# Patient Record
Sex: Female | Born: 1960 | Race: White | Hispanic: No | Marital: Married | Smoking: Never smoker
Health system: Southern US, Community
[De-identification: ages and names within clinical notes are randomized; demographics above are authoritative.]

## PROBLEM LIST (undated history)

## (undated) DIAGNOSIS — I10 Essential (primary) hypertension: Secondary | ICD-10-CM

## (undated) DIAGNOSIS — J8410 Pulmonary fibrosis, unspecified: Secondary | ICD-10-CM

## (undated) DIAGNOSIS — I471 Supraventricular tachycardia, unspecified: Secondary | ICD-10-CM

## (undated) DIAGNOSIS — I4949 Other premature depolarization: Secondary | ICD-10-CM

## (undated) DIAGNOSIS — E7800 Pure hypercholesterolemia, unspecified: Secondary | ICD-10-CM

## (undated) DIAGNOSIS — E119 Type 2 diabetes mellitus without complications: Secondary | ICD-10-CM

## (undated) DIAGNOSIS — I499 Cardiac arrhythmia, unspecified: Secondary | ICD-10-CM

## (undated) DIAGNOSIS — D45 Polycythemia vera: Secondary | ICD-10-CM

## (undated) DIAGNOSIS — I517 Cardiomegaly: Secondary | ICD-10-CM

## (undated) DIAGNOSIS — K219 Gastro-esophageal reflux disease without esophagitis: Secondary | ICD-10-CM

## (undated) DIAGNOSIS — IMO0002 Reserved for concepts with insufficient information to code with codable children: Secondary | ICD-10-CM

## (undated) DIAGNOSIS — R079 Chest pain, unspecified: Principal | ICD-10-CM

## (undated) DIAGNOSIS — I2510 Atherosclerotic heart disease of native coronary artery without angina pectoris: Secondary | ICD-10-CM

## (undated) DIAGNOSIS — B182 Chronic viral hepatitis C: Secondary | ICD-10-CM

## (undated) DIAGNOSIS — I440 Atrioventricular block, first degree: Secondary | ICD-10-CM

## (undated) DIAGNOSIS — E669 Obesity, unspecified: Secondary | ICD-10-CM

## (undated) DIAGNOSIS — E785 Hyperlipidemia, unspecified: Secondary | ICD-10-CM

## (undated) DIAGNOSIS — J189 Pneumonia, unspecified organism: Secondary | ICD-10-CM

## (undated) DIAGNOSIS — I739 Peripheral vascular disease, unspecified: Secondary | ICD-10-CM

## (undated) DIAGNOSIS — J45909 Unspecified asthma, uncomplicated: Secondary | ICD-10-CM

## (undated) DIAGNOSIS — R002 Palpitations: Secondary | ICD-10-CM

## (undated) DIAGNOSIS — R519 Headache, unspecified: Secondary | ICD-10-CM

## (undated) HISTORY — DX: Pulmonary fibrosis, unspecified: J84.10

## (undated) HISTORY — DX: Pure hypercholesterolemia, unspecified: E78.00

## (undated) HISTORY — DX: Cardiomegaly: I51.7

## (undated) HISTORY — DX: Chest pain, unspecified: R07.9

## (undated) HISTORY — DX: Hyperlipidemia, unspecified: E78.5

## (undated) HISTORY — DX: Supraventricular tachycardia: I47.1

## (undated) HISTORY — DX: Atrioventricular block, first degree: I44.0

## (undated) HISTORY — PX: CATARACT EXTRACTION W/ INTRAOCULAR LENS IMPLANT: SHX1309

## (undated) HISTORY — DX: Polycythemia vera: D45

## (undated) HISTORY — PX: COLONOSCOPY: SHX174

## (undated) HISTORY — DX: Essential (primary) hypertension: I10

## (undated) HISTORY — DX: Other premature depolarization: I49.49

## (undated) HISTORY — DX: Chronic viral hepatitis C: B18.2

## (undated) HISTORY — PX: LUMBAR LAMINECTOMY/DECOMPRESSION WITH DISCECTOMY: SHX7439

## (undated) HISTORY — DX: Palpitations: R00.2

## (undated) HISTORY — DX: Unspecified asthma, uncomplicated: J45.909

## (undated) HISTORY — PX: ABDOMINAL HYSTERECTOMY: SHX81

## (undated) HISTORY — PX: TONSILLECTOMY: SUR1361

## (undated) HISTORY — PX: FRACTURE SURGERY: SHX138

## (undated) HISTORY — PX: MYRINGOTOMY: SHX2060

## (undated) HISTORY — PX: CHOLECYSTECTOMY: SHX55

## (undated) HISTORY — DX: Supraventricular tachycardia, unspecified: I47.10

## (undated) HISTORY — DX: Obesity, unspecified: E66.9

## (undated) HISTORY — DX: Reserved for concepts with insufficient information to code with codable children: IMO0002

## (undated) HISTORY — DX: Atherosclerotic heart disease of native coronary artery without angina pectoris: I25.10

## (undated) HISTORY — PX: APPENDECTOMY: SHX54

## (undated) HISTORY — PX: ROTATOR CUFF REPAIR: SHX139

---

## 1993-12-14 HISTORY — PX: SINUS SURGERY WITH INSTATRAK: SHX5215

## 2004-01-04 ENCOUNTER — Encounter: Admission: RE | Admit: 2004-01-04 | Discharge: 2004-01-04 | Payer: Self-pay | Admitting: Infectious Diseases

## 2004-02-08 ENCOUNTER — Ambulatory Visit (HOSPITAL_COMMUNITY): Admission: RE | Admit: 2004-02-08 | Discharge: 2004-02-08 | Payer: Self-pay | Admitting: Infectious Diseases

## 2004-02-08 ENCOUNTER — Encounter: Admission: RE | Admit: 2004-02-08 | Discharge: 2004-02-08 | Payer: Self-pay | Admitting: Infectious Diseases

## 2006-12-14 HISTORY — PX: CARDIAC CATHETERIZATION: SHX172

## 2007-05-26 ENCOUNTER — Ambulatory Visit: Payer: Self-pay | Admitting: Cardiology

## 2007-06-02 ENCOUNTER — Ambulatory Visit: Payer: Self-pay | Admitting: Cardiology

## 2007-08-02 ENCOUNTER — Ambulatory Visit: Payer: Self-pay | Admitting: Cardiology

## 2007-08-03 ENCOUNTER — Ambulatory Visit: Payer: Self-pay | Admitting: Cardiology

## 2007-08-03 ENCOUNTER — Ambulatory Visit (HOSPITAL_COMMUNITY): Admission: RE | Admit: 2007-08-03 | Discharge: 2007-08-03 | Payer: Self-pay | Admitting: Cardiology

## 2007-08-16 ENCOUNTER — Ambulatory Visit: Payer: Self-pay | Admitting: Cardiology

## 2007-08-18 ENCOUNTER — Encounter (HOSPITAL_COMMUNITY): Admission: RE | Admit: 2007-08-18 | Discharge: 2007-09-13 | Payer: Self-pay | Admitting: Endocrinology

## 2008-08-08 ENCOUNTER — Ambulatory Visit (HOSPITAL_COMMUNITY): Admission: RE | Admit: 2008-08-08 | Discharge: 2008-08-08 | Payer: Self-pay | Admitting: Urology

## 2011-04-28 NOTE — Discharge Summary (Signed)
NAMESIREN, PORRATA              ACCOUNT NO.:  [id number]   MEDICAL RECORD NO.:  [id number]          PATIENT TYPE:  OIB   LOCATION:  2807                         FACILITY:  [facility]   PHYSICIAN:  Maple Mirza, PA   DATE OF BIRTH:  06-13-1961   DATE OF ADMISSION:  08/03/2007  DATE OF DISCHARGE:  08/03/2007                               DISCHARGE SUMMARY   ADDENDUM   This patient has allergies to aspirin, ibuprofen and peanuts.   This addendum concerns the patient's symptomatic premature ventricular  complexes.  I had mentioned in the original discharge summary a monitor  might be obtained for this patient because she has a reported history of  near syncope two weeks ago.  Another reason for the monitor might be to  see if Toprol XL 25 mg which has been started with this admission will  decrease the number of symptomatic premature ventricular complexes that  this patient is experiencing.      Maple Mirza, PA     GM/MEDQ  D:  08/03/2007  T:  08/04/2007  Job:  [id number]   cc:   Luis Abed, MD, [facility]  Dhruv Sherril Croon

## 2011-04-28 NOTE — Discharge Summary (Signed)
NAMEMAHALIA, Connie Ho              ACCOUNT NO.:  [id number]   MEDICAL RECORD NO.:  [id number]          PATIENT TYPE:  OIB   LOCATION:  2807                         FACILITY:  [facility]   PHYSICIAN:  Maple Mirza, PA   DATE OF BIRTH:  March 20, 1961   DATE OF ADMISSION:  08/03/2007  DATE OF DISCHARGE:  08/03/2007                               DISCHARGE SUMMARY   FINAL DIAGNOSES:  1. Six month history of left arm discomfort with exertion.  2. Six weeks of progressive exertional dyspnea.  3. Adenosine Myoview study in 06/08 demonstrating small anterior and      inferior defect with small degree of reversibility, ejection      fraction of 58%.  4. The patient has symptomatic PVCs.  5. On 08/03/07, left heart catheterization, study showed that the      right coronary artery is small and nondominant, the LAD has a 30%      stenosis after the first diagonal and the first diagonal has a 50%      proximal stenosis, the left circumflex at its obtuse marginal      branches are free of angiographically significant coronary artery      disease.  There is no mitral regurgitation, ejection fraction about      65%, Dr. Nona Dell.   SECONDARY DIAGNOSES:  1. Recent oncology workup/hematology workup for polycythemia with      studies and follow-up pending.  2. Risk factors for coronary artery disease are:      a.     Hypertension.      b.     Hypercholesterolemia.  3. Myoview study in 2003, no defects, ejection fraction of 62%.  4. Asthma.  5. Migraine headaches.  6. History of hepatitis C with no medical therapy.  7. Irritable bowel syndrome.  8. History of seizure disorder.  9. Obesity.  10.History of neurocardiogenic syncope with positive tilt study, [facility].   PAST SURGICAL HISTORY:  1. Sinus surgery.  2. Status post hysterectomy with bilateral oophorectomy for benign      endometriosis.  3. Status post cholecystectomy.   PROCEDURE PERFORMED:  On  08/03/07, left heart catheterization.  The  study as dictated above showed the LAD had a 30% stenosis after the  first diagonal, the first diagonal itself had a 50% proximal stenosis,  otherwise the left circumflex and the right coronary artery are  angiographically free of coronary artery disease, ejection fraction 65%,  Dr. Nona Dell.   BRIEF HISTORY:  The patient is a 50 year old female.  She has no  documented history of coronary artery disease but she has risk factors  including hypertension and dyslipidemia.  The patient does not have a  strong family coronary artery disease history, diabetes, peripheral  vascular occlusive disease.  The patient had presented in 06/08 with a  six month history of left arm discomfort associated with exertion and  relieved with rest.  There is no anterior chest discomfort, however, in  the last 6-8 weeks she has also noted significant exertional dyspnea.  She was referred  then for further evaluation.  The 12 lead  electrocardiogram shows normal sinus rhythm, 70 beats per minute with  left axis deviation, no ischemic changes.  At that time she was  scheduled for a repeat Myoview study.  This study showed small anterior  and inferior defects and was thought to be a low risk study.  The  patient then presented to [facility] on 08/02/07 with chest pain  radiating across the chest, not into the arm or the neck.  Her BNP on  admission was 16, but because the patient had an indeterminate stress  test in June, and has had no recurrent pain after a six month history of  exertional left arm pain, the patient was transferred to [facility] for a left heart catheterization.   HOSPITAL COURSE:  The patient had transfer admission from [facility] for diagnostic left heart catheterization.  This study showed  that the coronary arteries had only mild non-obstructive coronary artery  disease in the LAD and the first diagonal with  preserved ejection  fraction, patient discharging the same day.  She will go home on Toprol-  XL 25 mg daily, a new medication, primarily to help quell symptomatic,  PVCs.  She also has of medications of Protonix 40 mg daily, Diovan 160  mg daily, and Topamax 100 mg at bedtime.  She will also continue her  Maxzide 37.5/25 daily.   With regard to follow-up because the patient had a report of near  syncope about two weeks prior to this admission for which she did not  seek medical attention, there may be a question that the patient will be  put on a monitor when she sees Dr. Myrtis Ser.  The office visit with Dr. Myrtis Ser  is Tuesday, 08/16/07 at 9:45.   LABORATORY STUDIES:  The laboratory studies associated with this  hospitalization are troponin I studies times two were 0.02 and 0.02.  Her protime is 12.8, INR is 0.9.  On 08/02/07 white cells were 7.9,  hemoglobin 15.5, hematocrit 43.5, and platelets 293.  Serum electrolytes  show the sodium is 140, potassium is four, chloride 114, carbonate 29,  BUN is 10, creatinine 0.6, glucose 120, and the TSH 08/19 1.29.      Maple Mirza, PA     GM/MEDQ  D:  08/03/2007  T:  08/04/2007  Job:  [id number]   cc:   Roosevelt Locks, PA  Luis Abed, MD, [facility]  Learta Codding, MD,FACC

## 2011-04-28 NOTE — Assessment & Plan Note (Signed)
[facility]                          [facility] OFFICE NOTE   TENLEY, WINWARD                     MRN:          [id number]  DATE:08/16/2007                            DOB:          1961-06-14    Connie Ho is doing well.  We had assessed  her in June 2008 for some  chest discomfort.  She then had more symptoms and she was transferred to  [facility] for cardiac catheterization.  The study showed that the LAD  tapered to a small diameter in the mid and distal segment.  There was a  50% stenosis of the first diagonal, and this was a small caliber vessel.  There was a 30% stenosis in the mid LAD.  There were no clear-cut flow  limiting lesions.  The circumflex was a large vessel, and there was no  significant disease.  The right coronary is relatively small and  nondominant, and there was no significant disease.  The LV function was  65%, with no significant wall motion abnormalities.  It was felt that  the patient did not need an intervention.  She is to be treated  medically.   She returns now to the office and is feeling well.  She has no problems  with her catheterization site.  It had been mentioned that she had some  symptomatic PVCs.  There was a question of whether she should wear a  monitor, and she and I have discussed this today (see the discussion  below).   The patient also mentions that there is a question of  hyperparathyroidism, and this is being evaluated by Dr. Patrecia Pace in  [place], [place].  She is seeing Dr. Ubaldo Glassing for the follow-up of her  hematologic issues.  Also, there is question as to whether or not she  has hepatitis C.  This affects whether she can be on cholesterol  medications.  Certainly being on a statin would be recommended for her  secondary prevention, if it can be used.   PAST MEDICAL HISTORY:   ALLERGIES:  Antiphylactic reaction to ASPIRIN.  Hives from IBUPROFEN.   MEDICATIONS:  Maxide,  hydrochlorothiazide, Topamax, Diovan, Toprol 25  mg.   OTHER MEDICAL PROBLEMS:  See the list below.   REVIEW OF SYSTEMS:  The patient still has some mild palpitations.  However, she is not having any syncope or presyncope.  In general, she  is feeling somewhat better.  Her review of systems is otherwise  negative.   PHYSICAL EXAMINATION:  Blood pressure 134/95; she needs follow-up of her  blood pressure.  If her blood pressure remains elevated, she will need  the addition of another medicine (such as Norvasc).  The patient is  oriented to person, time and place.  Affect is normal.  HEENT:  Reveals no xanthelasma.  She has normal extraocular motion.  NECK:  There are no carotid bruits.  There is no jugular venous  distention.  LUNGS:  Clear.  Respiratory effort is not labored.  CARDIAC:  Exam reveals an S1 with an S2.  There are no clicks or  significant murmurs.  ABDOMEN:  Soft.  She has normal bowel sounds.  She has no peripheral  edema.   LABORATORY STUDIES:  None are done.   PROBLEMS:  1. CHEST DISCOMFORT.  We believe that there is not significant      coronary ischemia.  However, she does have mild coronary disease      and secondary prevention is very important.  2. HISTORY OF NEUROCARDIOGENIC SYNCOPE IN THE PAST.  With a positive      tilt test at [facility] in 1996.  3. HYPERTENSION.  She needs follow-up of this, to see if she needs      more medications.  4. HYPERLIPIDEMIA.  Using a statin would be very helpful.  I hope that      this can be strongly considered after her liver status is assessed.  5. HISTORY OF ASTHMA.  6. HISTORY OF HEPATITIS C.  7. HISTORY OF MIGRAINES.  8. HISTORY OF SEIZURE DISORDER.  9. OBESITY.  10.STATUS POST CHOLECYSTECTOMY.  11.STATUS POST HYSTERECTOMY.  12.STATUS POST SINUS SURGERY.  13.MILD LEFT VENTRICULAR HYPERTROPHY.  By 2-D echocardiogram in the      past.  14.HISTORY OF ANAPHYLAXIS FROM ASPIRIN and HISTORY OF HIVES FROM       IBUPROFEN.  15.MILD PALPITATIONS.   The patient has symptomatic infrequent PVCs.  I had a lengthy discussion  with the patient, explaining to her how she can feel post PVC beats.  I  spent time reassuring her.   I believe her cardiac status is stable.  We can see her back for  cardiology follow-up in 6 months.     Luis Abed, MD, [facility]  Electronically Signed    JDK/MedQ  DD: 08/16/2007  DT: 08/16/2007  Job #: [id number]   cc:   Norman Herrlich, M.D.  Boris M. Darovsky, M.D.

## 2011-04-28 NOTE — Cardiovascular Report (Signed)
Connie Ho, Connie Ho              ACCOUNT NO.:  [id number]   MEDICAL RECORD NO.:  [id number]          PATIENT TYPE:  OIB   LOCATION:  2807                         FACILITY:  [facility]   PHYSICIAN:  Jonelle Sidle, MD DATE OF BIRTH:  [birth year]   DATE OF PROCEDURE:  DATE OF DISCHARGE:                            CARDIAC CATHETERIZATION   PRIMARY CARE PHYSICIAN:  Doreen Beam, M.D.   CARDIOLOGIST:  Luis Abed, MD, [facility]   INDICATIONS:  Ms. Furber is a 50 year old woman with a previously  diagnosed history of neurocardiogenic syncope, symptomatic premature  ventricular complexes, hypertension, hyperlipidemia, and recent episodes  of chest pain.  She underwent an adenosine Cardiolite in June which was  overall low risk and revealed small anterior and inferior defects of  unclear significance for ischemia with a small degree of reversibility  by report.  She had normal ejection fraction noted at that time as well.  She was seen by Dr. Myrtis Ser in the office and subsequently admitted to the  hospital with recurrent episodes of chest pain, being evaluated by Dr.  Andee Lineman at that time.  She ruled out for myocardial infarction, and  following a discussion of the risks and benefits is now referred for a  diagnostic cardiac catheterization to clearly assess the coronary  anatomy.  Informed consent was obtained.   PROCEDURE PERFORMED:  1. Left heart catheterization.  2. Selective coronary angiography.  3. Left ventriculography.   ACCESS AND EQUIPMENT:  The area about the right femoral artery was  anesthetized with 1% lidocaine, and a 6-French sheath was placed in the  right femoral artery via the modified Seldinger technique.  Standard  preformed 6-French JL-4 and JR-4 catheters were used for selective  coronary angiography, and an angled pigtail catheter was used for left  heart catheterization and left ventriculography.  A total of 85 mL  Omnipaque were used.  The patient tolerated the  procedure well without  immediate complications.   HEMODYNAMIC RESULTS:  Aorta 122/66 mmHg.  Left ventricle 122/2 mmHg.   ANGIOGRAPHIC FINDINGS:  1. The left main coronary artery is free of significant flow-limiting      coronary atherosclerosis.  This vessel gives rise to the left      anterior descending and circumflex vessels.  2. The left anterior descending is a medium caliber vessel that tapers      to a small diameter in the mid to distal segment.  There are two      diagonal branches, the first of which has a 50% ostial stenosis.      These are relatively small in caliber.  There is a 30% stenosis      within the mid left anterior descending.  No clear flow-limiting      stenoses are noted, however.  3. The circumflex vessel is a medium to large caliber dominant vessel      with posterior descending branch and three obtuse marginal      branches.  The first obtuse marginal was quite large and      bifurcates.  There was no significant flow-limiting coronary  atherosclerosis noted.  4. The right coronary artery is relatively small and nondominant.      There was no significant flow-limiting coronary atherosclerosis      noted.   LEFT VENTRICULOGRAPHY:  Left ventriculography is performed in the RAO  projection and reveals an ejection fraction of approximately 65% with no  significant mitral regurgitation or focal anterior or inferior wall  motion abnormalities.   DIAGNOSES:  1. Mild coronary atherosclerosis without any major flow-limiting      disease within the large epicardial vessels.  The coronary system      is left dominant.  2. The left ventricular ejection fraction is approximately 65% without      significant mitral regurgitation, with a left ventricular end-      diastolic pressure of 2 mmHg.   DISCUSSION:  Reviewed the results with the patient, her family, and with  Dr. Andee Lineman by phone.  We will plan to discharge her home later today.  We are adding a  low-dose beta-blocker (Toprol-XL 25 mg daily), given her  history of symptomatic premature ventricular complexes as well as  previously diagnosed neurocardiogenic syncope.  She will have followup  with Dr. Myrtis Ser or physician assistant in the office over the next 2  weeks.      Jonelle Sidle, MD  Electronically Signed     SGM/MEDQ  D:  08/03/2007  T:  08/04/2007  Job:  [id number]   cc:   Sherian Maroon, MD, [facility]  Learta Codding, MD,FACC

## 2011-04-28 NOTE — Assessment & Plan Note (Signed)
[facility]                          [facility]ICE NOTE   Connie Ho, Connie Ho                     MRN:          [id number]  DATE:05/26/2007                            DOB:          Jan 19, 1961    REASON FOR CONSULTATION:  Connie Ho is a very pleasant 50 -year-old  female, with no documented history of coronary artery disease, but  numerous risk factors who is now referred to Dr.  Zackery Barefoot for  further evaluation of exertional dyspnea and associated left arm  discomfort.   The patient has actually been seen by several members of our team in the  past. In August 2003, she was seen here at [facility] in  consultation by Dr.  Simona Huh for evaluation of recurrent chest  pain. An adenosine stress test at that time showed no significant  perfusion defects; calculated ejection fraction 62%.   Prior to that, the patient had had a suboptimal exercise stress  echocardiogram in 2001 which was interpreted as negative. She also had a  2D echo in 2002 revealing normal left ventricular function with mild  left ventricular hypertrophy. More recently, the patient reports having  had a repeat 2D echo, per Dr.  Sherril Croon, in his office last week, but a  report of this study is still pending.   The patient's cardiac risk factors are notable for longstanding  hypertension (treated with medication since the age of 32) and  hyperlipidemia, but negative for diabetes mellitus, history of tobacco  smoking or family history of premature coronary artery disease.   The patient presents with complaint of a 6 month history of left arm  discomfort associated with exertion and relieved with rest. There is no  associated anterior chest discomfort. However, in the last 6-8 weeks,  she has also noted significant exertional dyspnea. Given these current  symptoms, she is now referred to [place] for further evaluation.  A recent 12-  lead electrocardiogram shows  normal sinus rhythm at 70 bpm with left  axis deviation and no ischemic changes.   ALLERGIES:  ASPIRIN (ANAPHYLAXIS) AND IBUPROFEN.   CURRENT MEDICATIONS:  1. Maxzide one tablet daily.  2. Diovan 80 mg daily.  3. Topamax 100 mg q nightly.   PAST MEDICAL HISTORY:  1. Recurrent angina pectoris.      a.     As outlined above.  2. Neurocardiogenic syncope.      a.     Positive tilt table test at [facility]       in [place].  3. Hypertension.  4. Hyperlipidemia.  5. Asthma.  6. Hepatitis C.  7. History of migraines.  8. History of seizure disorder.  9. Obesity.   PAST SURGICAL HISTORY:  1. Status post cholecystectomy.  2. Total abdominal hysterectomy/bilateral salpingo oophorectomy.  3. Sinus surgery.   SOCIAL HISTORY:  The patient is married, has four children and four  grandchildren. She has never smoked tobacco and denies alcohol use. She  works for [employer] in [place].   FAMILY HISTORY:  Father deceased at age 62, question complications of  stroke;  history of congestive heart failure. Mother is age 84, alive and  well with no known history of coronary disease.   REVIEW OF SYSTEMS:  The patient denies any symptoms of reflux disease.  Otherwise, as noted per HPI, remaining systems negative.   PHYSICAL EXAMINATION:  Blood pressure 140/98, pulse 73 and irregular.  Weight is 203.  GENERAL: A 50 year old female, sitting upright in no distress.  HEENT: Normocephalic, atraumatic.  NECK: Palpable bilateral carotid pulses without bruits; no JVD at 90  degrees.  LUNGS:  Clear to auscultation in all fields.  HEART: Regular rate and rhythm (S1, S2). No significant murmurs. No  rales.  ABDOMEN: Protuberant, nontender with intact bowel sounds.  EXTREMITIES: Palpable distal pulses with trace pedal edema.  NEURO: No focal deficit.   Recent laboratory data:  Total cholesterol is 281, triglycerides 169, HDL 56, LDL of 191. CBC  notable for mildly  elevated hemoglobin of 16/hematocrit 46. Renal  function and electrolytes within normal limits, mildly elevated glucose  at 107. TSH 1.19.   IMPRESSION:  1. Exertional dyspnea/left arm discomfort.      a.     Question anginal equivalent.      b.     Negative adenosine stress Cardiolite: Ejection fraction of       62%, August 2003.      c.     Negative inadequate exercise stress echocardiogram, March       2001.  2. Longstanding hypertension.      a.     Mild LVH by 2D echo in 2002.  3. Remote history of neurocardiogenic syncope.      a.     Status post positive tilt table test at [facility] in 1996.  4. Asthma.  5. ASPIRIN ALLERGY.  6. Longstanding history of palpitations.  7. Obesity.  8. Hyperlipidemia.   PLAN:  1. Schedule repeat pharmacologic stress test with an adenosine      Cardiolite for risk stratification. We will keep a low threshold      for proceeding with a diagnostic coronary angiogram if there is any      suggestion of ischemia.  2. Request recent 2D echo report from Dr.  Sherril Croon office.  3. Schedule early return clinic followup with myself and Dr.  Zackery Barefoot in the next several weeks for review of stress test results,      and further recommendations.      Rozell Searing, PA-C  Electronically Signed      Luis Abed, MD, [facility]  Electronically Signed   GS/MedQ  DD: 05/26/2007  DT: 05/26/2007  Job #: [id number]   cc:   Doreen Beam

## 2011-09-22 DIAGNOSIS — R072 Precordial pain: Secondary | ICD-10-CM

## 2011-09-28 DIAGNOSIS — R0602 Shortness of breath: Secondary | ICD-10-CM

## 2011-09-28 DIAGNOSIS — R079 Chest pain, unspecified: Secondary | ICD-10-CM

## 2011-09-29 ENCOUNTER — Encounter: Payer: Self-pay | Admitting: Cardiovascular Disease

## 2011-09-29 ENCOUNTER — Telehealth: Payer: Self-pay | Admitting: *Deleted

## 2011-09-29 ENCOUNTER — Encounter: Payer: Self-pay | Admitting: *Deleted

## 2011-09-29 ENCOUNTER — Ambulatory Visit (INDEPENDENT_AMBULATORY_CARE_PROVIDER_SITE_OTHER): Payer: 59 | Admitting: Cardiovascular Disease

## 2011-09-29 DIAGNOSIS — I10 Essential (primary) hypertension: Secondary | ICD-10-CM

## 2011-09-29 DIAGNOSIS — R0602 Shortness of breath: Secondary | ICD-10-CM

## 2011-09-29 DIAGNOSIS — R9430 Abnormal result of cardiovascular function study, unspecified: Secondary | ICD-10-CM

## 2011-09-29 DIAGNOSIS — R079 Chest pain, unspecified: Secondary | ICD-10-CM

## 2011-09-29 MED ORDER — NITROGLYCERIN 0.4 MG SL SUBL: 0.4000 mg | SUBLINGUAL_TABLET | SUBLINGUAL | Status: DC | PRN

## 2011-09-29 NOTE — Patient Instructions (Addendum)
Your physician has requested that you have a cardiac catheterization. Cardiac catheterization is used to diagnose and/or treat various heart conditions. Doctors may recommend this procedure for a number of different reasons. The most common reason is to evaluate chest pain. Chest pain can be a symptom of coronary artery disease (CAD), and cardiac catheterization can show whether plaque is narrowing or blocking your heart's arteries. This procedure is also used to evaluate the valves, as well as measure the blood flow and oxygen levels in different parts of your heart. For further information please visit [email or web address] Please follow instruction sheet, as given. Use Nitroglycerin 0.4 mg under tongue as needed as directed for chest pain.

## 2011-09-29 NOTE — Progress Notes (Signed)
HPI   this is a 50 year old female who is here today for a followup visit. She was seen recently at [facility] due to the chest pain. The patient ruled out for myocardial infarction in her ECG did not show any acute changes. She was discharged home with an arrangement for an outpatient treadmill stress test. Since her hospital discharge, she had more episodes of chest pain described as substernal chest tightness radiating to her throat. These episodes happened while she was going upstairs. One episode lasted for 15 minutes. She felt sick in her stomach. She underwent a treadmill stress test and was able to exercise for 7 minutes. However, at peak stress, she had 1 mm of horizontal ST depression in the inferior and inferolateral leads which persisted for 4 minutes into recovery and was associated with substernal chest tightness. The patient did have cardiac catheterization in 2008 which showed mild nonobstructive coronary artery disease with a moderate lesion in a diagonal branch. She is allergic to aspirin and reports hives with it. She does have family history of coronary artery disease.  she did have associated heartburn but that has actually improved after she was started on Protonix.  She also has known history of asthma but that has been under reasonable control with no recent wheezing.  Allergies  Allergen Reactions  . Aspirin Hives  . Ibuprofen Hives     No current outpatient prescriptions on file prior to visit.     Past Medical History  Diagnosis Date  . Chest pain, unspecified 2008    Cardiac Catheterization   . Palpitations   . Pure hypercholesterolemia   . Unspecified asthma   . Postinflammatory pulmonary fibrosis   . Cardiomegaly   . Polycythemia vera   . Chronic hepatitis C without mention of hepatic coma   . Obesity, unspecified   . Other and unspecified hyperlipidemia   . Unspecified asthma   . Degenerative disk disease   . Other premature beats   . First degree  atrioventricular block   . Unspecified essential hypertension      Past Surgical History  Procedure Date  . Cholecystectomy     post  . Abdominal hysterectomy     Bilateral salpingo-oophorectomy  . Sinus surgery with instatrak 1995  . Cardiac catheterization 2008    mild CAD. 50% diagonal stenosis.      Family History  Problem Relation Age of Onset  . Stroke Father 49  . Coronary artery disease Mother      History   Social History  . Marital Status: Married    Spouse Name: STEVEN     Number of Children: 4  . Years of Education: N/A   Occupational History  . SELF EMPLOYED    Social History Main Topics  . Smoking status: Never Smoker   . Smokeless tobacco: Never Used  . Alcohol Use: No  . Drug Use: No  . Sexually Active: Not on file   Other Topics Concern  . Not on file   Social History Narrative  . No narrative on file     ROS Constitutional: Negative for fever, chills, diaphoresis, activity change, appetite change and fatigue.  HENT: Negative for hearing loss, nosebleeds, congestion, sore throat, facial swelling, drooling, trouble swallowing, neck pain, voice change, sinus pressure and tinnitus.  Eyes: Negative for photophobia, pain, discharge and visual disturbance.  Respiratory: Negative for apnea, cough and wheezing.  Cardiovascular: Negative for palpitations and leg swelling.  Gastrointestinal: Negative for nausea, vomiting, abdominal pain, diarrhea,  constipation, blood in stool and abdominal distention.  Genitourinary: Negative for dysuria, urgency, frequency, hematuria and decreased urine volume.  Musculoskeletal: Negative for myalgias, back pain, joint swelling, arthralgias and gait problem.  Skin: Negative for color change, pallor, rash and wound.  Neurological: Negative for dizziness, tremors, seizures, syncope, speech difficulty, weakness, light-headedness, numbness and headaches.  Psychiatric/Behavioral: Negative for suicidal ideas,  hallucinations, behavioral problems and agitation. The patient is not nervous/anxious.     PHYSICAL EXAM   BP 116/78  Pulse 61  Ht 5\' 6"  (1.676 m)  Wt 200 lb (90.719 kg)  BMI 32.28 kg/m2  Constitutional: She is oriented to person, place, and time. She appears well-developed and well-nourished. No distress.  HENT: No nasal discharge.  Head: Normocephalic and atraumatic.  Eyes: Pupils are equal, round, and reactive to light. Right eye exhibits no discharge. Left eye exhibits no discharge.  Neck: Normal range of motion. Neck supple. No JVD present. No thyromegaly present.  Cardiovascular: Normal rate, regular rhythm, normal heart sounds and intact distal pulses. Exam reveals no gallop and no friction rub.  No murmur heard.  Pulmonary/Chest: Effort normal and breath sounds normal. No stridor. No respiratory distress. She has no wheezes. She has no rales. She exhibits no tenderness.  Abdominal: Soft. Bowel sounds are normal. She exhibits no distension. There is no tenderness. There is no rebound and no guarding.  Musculoskeletal: Normal range of motion. She exhibits no edema and no tenderness.  Neurological: She is alert and oriented to person, place, and time. Coordination normal.  Skin: Skin is warm and dry. No rash noted. She is not diaphoretic. No erythema. No pallor.  Psychiatric: She has a normal mood and affect. Her behavior is normal. Judgment and thought content normal.      ASSESSMENT AND PLAN

## 2011-09-29 NOTE — Telephone Encounter (Signed)
(  L) HEART CATH JV LAB DR. ARIDA OCT. 24, 2012  ARRIVE 0930 FOR 1030 CASE

## 2011-09-29 NOTE — Assessment & Plan Note (Signed)
Her blood pressure was running low. It improved after cutting the dose of diuretic to half a tablet once daily.

## 2011-09-29 NOTE — Assessment & Plan Note (Signed)
The patient continues to have substernal chest tightness in spite of treating her with Protonix for presumed gastroesophageal reflux disease. His current symptoms are associated with physical activities and are worrisome for angina.   Treadmill stress test  was abnormal  with 1 mm of ST depression in the inferior and inferolateral leads.  I discussed the stress test results with the patient extensively. Although, a treadmill stress test can be false positive especially in females, I am concerned about the nature of her symptoms which seems to be highly suggestive of angina at this time. Also her chest pain did not resolve after treating her for presumed GERD.   Due to that, I recommend proceeding with cardiac catheterization and possible coronary intervention. Risks, benefits and alternatives were discussed with the patient. She is allergic to aspirin. I provided her with Nitrostat to be used as needed. If PCI is needed, it will likely need to be staged until she is loaded with an antiplatelet agent.

## 2011-09-29 NOTE — Telephone Encounter (Signed)
Auth # [id number] exp 11/13/11

## 2011-10-01 LAB — PROTIME-INR

## 2011-10-05 ENCOUNTER — Encounter: Payer: Self-pay | Admitting: *Deleted

## 2011-10-06 ENCOUNTER — Encounter: Payer: Self-pay | Admitting: Cardiovascular Disease

## 2011-10-07 ENCOUNTER — Inpatient Hospital Stay (HOSPITAL_BASED_OUTPATIENT_CLINIC_OR_DEPARTMENT_OTHER)
Admission: RE | Admit: 2011-10-07 | Discharge: 2011-10-07 | Disposition: A | Discharge status: Home / Self Care | Payer: 59 | Source: Ambulatory Visit | Attending: Cardiovascular Disease | Admitting: Cardiovascular Disease

## 2011-10-07 DIAGNOSIS — R079 Chest pain, unspecified: Secondary | ICD-10-CM | POA: Insufficient documentation

## 2011-10-07 DIAGNOSIS — I2510 Atherosclerotic heart disease of native coronary artery without angina pectoris: Secondary | ICD-10-CM

## 2011-10-13 NOTE — Cardiovascular Report (Signed)
Connie Ho, BOEH              ACCOUNT NO.:  [id number]  MEDICAL RECORD NO.:  [id number]  LOCATION:                                 FACILITY:  PHYSICIAN:  Lorine Bears, MD     DATE OF BIRTH:  Aug 25, 1961  DATE OF PROCEDURE:  10/07/2011 DATE OF DISCHARGE:                           CARDIAC CATHETERIZATION   PRIMARY CARE PHYSICIAN:  Fransisco Hertz, MD  PROCEDURES PERFORMED: 1. Left heart catheterization. 2. Coronary angiography. 3. Left ventricular angiography.  INDICATION AND CLINICAL HISTORY:  This is a 50 year old female who presented to [facility] with substernal chest pain.  She ruled out for myocardial infarction and was discharged home to have an outpatient stress test.  She underwent a treadmill stress test, which showed 1 mm of ST depression in the inferior and anterolateral leads, associated with mild chest tightness.  The patient continued to have chest pain in spite of treating her gastroesophageal reflux disease. Thus, I recommended proceeding with cardiac catheterization and possible coronary intervention.  She did have cardiac catheterization in 2008 which showed a moderate 50% lesion in a diagonal branch.  STUDY DETAILS:  A standard informed consent was obtained.  The right groin area was prepped in a sterile fashion.  It was anesthetized with 1% lidocaine.  A 4-French sheath was placed in the right femoral artery after an anterior puncture.  Coronary angiography was performed with a JL-4, 3DRC and a pigtail catheter.  All catheter exchanges were done over the wire.  The patient tolerated the procedure well with no immediate complications.  The sheath was removed and manual pressure was applied.  STUDY FINDINGS:  Hemodynamic Findings:  Left ventricular pressure is 170/16 with a left ventricular end-diastolic pressure of 18 mmHg. Aortic pressure is 172/88 with a mean pressure of 112 mmHg.  Left ventricular angiography:  This showed normal LV  systolic function and wall motion with an estimated ejection fraction of 60%.  Coronary angiography: 1. Left main coronary artery: The vessel is normal in size and free of     any significant disease. 2. Left circumflex artery:  The vessel is large in size and dominant.     It is smooth and has no significant atherosclerosis or obstructive     disease.  OM1 is very small.  OM2 is medium in size, and OM3 is     normal in size.  Distally, there is a PDA and 1 posterolateral     branch and both of them are free of significant disease. 3. Left anterior descending artery: The vessel is normal in size and     free of any significant disease.  First diagonal is a medium-sized     branch, but definitely less than 2 mm in diameter.  There is a 60%     ostial lesion.  Second and 3rd diagonals are very small in size. 4. Right coronary artery: The vessel is small in size and nondominant.     It is free of any significant disease.  STUDY CONCLUSIONS: 1. No significant coronary artery disease.  There is a 60% ostial     lesion in a small diagonal branch which is less than 2 mm  in     diameter. 2. Normal LV systolic function. 3. Moderately elevated systemic pressure and mildly elevated left     ventricular end-diastolic pressure.  RECOMMENDATIONS:  Medical therapy is advised.  No revascularization is indicated.  The diagonal branch is medium in size and the diameter is about 1.5 mm.  No angioplasty to this location is recommended.     Lorine Bears, MD     MA/MEDQ  D:  10/07/2011  T:  10/07/2011  Job:  [id number]  cc:   Fransisco Hertz, M.D.  Electronically Signed by Lorine Bears MD on 10/13/2011 04:08:53 PM

## 2011-10-29 ENCOUNTER — Encounter: Payer: Self-pay | Admitting: Cardiovascular Disease

## 2011-10-29 ENCOUNTER — Encounter: Payer: 59 | Admitting: Cardiovascular Disease

## 2011-11-23 ENCOUNTER — Encounter: Payer: 59 | Admitting: Cardiovascular Disease

## 2011-12-25 ENCOUNTER — Encounter: Payer: 59 | Admitting: Cardiovascular Disease

## 2013-01-12 ENCOUNTER — Encounter (INDEPENDENT_AMBULATORY_CARE_PROVIDER_SITE_OTHER): Payer: Self-pay | Admitting: *Deleted

## 2013-06-05 ENCOUNTER — Encounter: Payer: Self-pay | Admitting: Cardiovascular Disease

## 2013-06-05 DIAGNOSIS — I4891 Unspecified atrial fibrillation: Secondary | ICD-10-CM

## 2013-06-05 DIAGNOSIS — I498 Other specified cardiac arrhythmias: Secondary | ICD-10-CM

## 2013-06-22 ENCOUNTER — Ambulatory Visit (INDEPENDENT_AMBULATORY_CARE_PROVIDER_SITE_OTHER): Payer: 59 | Admitting: Physician Assistant

## 2013-06-22 ENCOUNTER — Encounter: Payer: Self-pay | Admitting: Physician Assistant

## 2013-06-22 VITALS — BP 129/86 | HR 82 | Ht 66.0 in | Wt 208.0 lb

## 2013-06-22 DIAGNOSIS — I2510 Atherosclerotic heart disease of native coronary artery without angina pectoris: Secondary | ICD-10-CM

## 2013-06-22 DIAGNOSIS — I471 Supraventricular tachycardia: Secondary | ICD-10-CM | POA: Insufficient documentation

## 2013-06-22 MED ORDER — DILTIAZEM HCL ER BEADS 120 MG PO CP24: 120.0000 mg | ORAL_CAPSULE | Freq: Two times a day (BID) | ORAL | Status: DC

## 2013-06-22 NOTE — Patient Instructions (Addendum)
   Finish current supply of the Toprol, then   Increase Tiazac (Diltiazem) to 120mg  twice a day  - new sent to pharm (take 8:00 am & 8:00 pm) Continue all other current medications. Follow up in  1 month

## 2013-06-22 NOTE — Assessment & Plan Note (Signed)
Patient reports improvement of her frequent tachycardia palpitations following the recent addition of diltiazem. However, she continues to experience these episodes several times a week, particularly when preparing for bed. Therefore, will increase diltiazem to 120 twice a day, with concomitant discontinuation of Toprol, after she finishes her current supply. As I had previously indicated, my preference is to treat her SVT with a calcium channel blocker, given her underlying asthma.

## 2013-06-22 NOTE — Progress Notes (Signed)
Primary Cardiologist: Jerral Bonito, MD (new)   HPI: Post hospital followup from [facility], following evaluation for palpitations and possible atrial fibrillation.  Patient presented with history of nonobstructive CAD, by cardiac catheterization in 2008, following a false positive GXT. She had known NL LVF, and long-standing history of palpitations, but with no previously documented arrhythmia. 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  Admission EKG yielded narrow complex tachycardia at approximately 180 bpm. Troponins, TSH NL.   - Echocardiogram, June 23: EF 65%, no focal WMAs; no significant valvular abnormalities.  Following initial treatment with IV diltiazem, with subsequent conversion to NSR, we recommended adding low-dose short-acting diltiazem, which was subsequently converted to 120 mg daily, by time of discharge. Patient was on Lopressor PTA, which was reduced to 25 mg daily at discharge, secondary to development of sinus bradycardia in the 50-55 bpm range. Of note, we recommended that patient be treated solely with calcium channel blocker, if possible, given her underlying asthma. We also found no definite evidence for anticoagulation.  She presents today reporting significant improvement overall in the frequency and duration of her palpitations. Specifically, she no longer feels them during the day. However, she continues to have them several times a week when laying in bed at night. These are minimally symptomatic, and typically last less than 1 minute in duration.   Twelve-lead EKG today, reviewed by me, indicates NSR 82 bpm; NSST changes   Allergies  Allergen Reactions  . Peanut-Containing Drug Products   . Aspirin Hives  . Ibuprofen Hives    Current Outpatient Prescriptions  Medication Sig Dispense Refill  . albuterol (PROVENTIL) (2.5 MG/3ML) 0.083% nebulizer solution Take 2.5 mg by  nebulization every 6 (six) hours as needed for wheezing.      . diltiazem (TIAZAC) 120 MG 24 hr capsule Take 120 mg by mouth daily.       . metoprolol succinate (TOPROL-XL) 25 MG 24 hr tablet Take 25 mg by mouth daily.       . nitroGLYCERIN (NITROSTAT) 0.4 MG SL tablet Place 1 tablet (0.4 mg total) under the tongue every 5 (five) minutes as needed for chest pain.  25 tablet  3  . pantoprazole (PROTONIX) 40 MG tablet Take 40 mg by mouth daily.        . traMADol (ULTRAM) 50 MG tablet Take 50 mg by mouth every 8 (eight) hours as needed.       . VENTOLIN HFA 108 (90 BASE) MCG/ACT inhaler Inhale 2 puffs into the lungs every 6 (six) hours as needed.        No current facility-administered medications for this visit.    Past Medical History  Diagnosis Date  . Chest pain, unspecified 2008    Cardiac Catheterization   . Palpitations   . Pure hypercholesterolemia   . Unspecified asthma(493.90)   . Postinflammatory pulmonary fibrosis   . Cardiomegaly   . Polycythemia vera(238.4)   . Chronic hepatitis C without mention of hepatic coma   . Obesity, unspecified   . Other and unspecified hyperlipidemia   . Unspecified asthma(493.90)   . Degenerative disk disease   . Other premature beats   . First degree atrioventricular block   . Unspecified essential hypertension   . PSVT (paroxysmal supraventricular tachycardia)   . CAD (coronary artery disease)     Nonobstructive, 09/2011 (false positive GXT)    Past Surgical History  Procedure Laterality Date  . Cholecystectomy  post  . Abdominal hysterectomy      Bilateral salpingo-oophorectomy  . Sinus surgery with instatrak  1995  . Cardiac catheterization  2008    mild CAD. 50% diagonal stenosis.     History   Social History  . Marital Status: Married    Spouse Name: STEVEN     Number of Children: 4  . Years of Education: N/A   Occupational History  . SELF EMPLOYED    Social History Main Topics  . Smoking status: Never Smoker    . Smokeless tobacco: Never Used  . Alcohol Use: No  . Drug Use: No  . Sexually Active: Not on file   Other Topics Concern  . Not on file   Social History Narrative  . No narrative on file    Family History  Problem Relation Age of Onset  . Stroke Father 54  . Coronary artery disease Mother     ROS: no nausea, vomiting; no fever, chills; no melena, hematochezia; no claudication  PHYSICAL EXAM: BP 129/86  Pulse 82  Ht 5\' 6"  (1.676 m)  Wt 208 lb (94.348 kg)  BMI 33.59 kg/m2 GENERAL: 52 year old female; NAD HEENT: NCAT, PERRLA, EOMI; sclera clear; no xanthelasma NECK: no JVD; no TM LUNGS: CTA bilaterally CARDIAC: RRR (S1, S2); no significant murmurs; no rubs or gallops ABDOMEN: soft, non-tender; intact BS EXTREMETIES: no significant peripheral edema SKIN: warm/dry; no obvious rash/lesions MUSCULOSKELETAL: no joint deformity NEURO: no focal deficit; NL affect   EKG: reviewed and available in Electronic Records   ASSESSMENT & PLAN:  PSVT (paroxysmal supraventricular tachycardia) Patient reports improvement of her frequent tachycardia palpitations following the recent addition of diltiazem. However, she continues to experience these episodes several times a week, particularly when preparing for bed. Therefore, will increase diltiazem to 120 twice a day, with concomitant discontinuation of Toprol, after she finishes her current supply. As I had previously indicated, my preference is to treat her SVT with a calcium channel blocker, given her underlying asthma.  CAD (coronary artery disease) No further workup indicated. Patient reports no CP, and recent hospitalization notable for NL cardiac markers and a NL echocardiogram.    Gene Lillar Bianca, PAC

## 2013-06-22 NOTE — Assessment & Plan Note (Signed)
No further workup indicated. Patient reports no CP, and recent hospitalization notable for NL cardiac markers and a NL echocardiogram.

## 2013-07-28 ENCOUNTER — Encounter: Payer: Self-pay | Admitting: Physician Assistant

## 2013-07-28 ENCOUNTER — Ambulatory Visit (INDEPENDENT_AMBULATORY_CARE_PROVIDER_SITE_OTHER): Payer: 59 | Admitting: Physician Assistant

## 2013-07-28 VITALS — BP 128/86 | HR 99 | Ht 66.0 in | Wt 209.0 lb

## 2013-07-28 DIAGNOSIS — R079 Chest pain, unspecified: Secondary | ICD-10-CM

## 2013-07-28 DIAGNOSIS — I471 Supraventricular tachycardia: Secondary | ICD-10-CM

## 2013-07-28 DIAGNOSIS — I10 Essential (primary) hypertension: Secondary | ICD-10-CM

## 2013-07-28 NOTE — Patient Instructions (Signed)
Continue all current medications. Your physician wants you to follow up in: 6 months.  You will receive a reminder letter in the mail one-two months in advance.  If you don't receive a letter, please call our office to schedule the follow up appointment   

## 2013-07-28 NOTE — Progress Notes (Signed)
Primary Cardiologist: Jerral Bonito, MD   HPI: Scheduled one-month followup.  Clinically, she reports marked improvement on the increased dose of diltiazem. Her palpitations now occur 1-2x/week, as opposed to previously occurring several times a day.  She also reports 2 recent episodes of CP this week, occurring at rest and lasting only a few seconds in duration. She characterized these as a tightness, and denied any associated tach palpitations. She also denies any interim development of exertional CP.   Twelve-lead EKG today, reviewed by me, indicates NSR 99 bpm; NSST changes  Allergies  Allergen Reactions  . Peanut-Containing Drug Products   . Aspirin Hives  . Ibuprofen Hives    Current Outpatient Prescriptions  Medication Sig Dispense Refill  . albuterol (PROVENTIL) (2.5 MG/3ML) 0.083% nebulizer solution Take 2.5 mg by nebulization every 6 (six) hours as needed for wheezing.      . diltiazem (TIAZAC) 120 MG 24 hr capsule Take 1 capsule (120 mg total) by mouth 2 (two) times daily.  60 capsule  6  . hydrOXYzine (ATARAX/VISTARIL) 25 MG tablet Take 25 mg by mouth at bedtime.      . pantoprazole (PROTONIX) 40 MG tablet Take 40 mg by mouth 2 (two) times daily.       . traMADol (ULTRAM) 50 MG tablet Take 50 mg by mouth every 8 (eight) hours as needed.       . VENTOLIN HFA 108 (90 BASE) MCG/ACT inhaler Inhale 2 puffs into the lungs every 6 (six) hours as needed.        No current facility-administered medications for this visit.    Past Medical History  Diagnosis Date  . Chest pain, unspecified 2008    Cardiac Catheterization   . Palpitations   . Pure hypercholesterolemia   . Unspecified asthma(493.90)   . Postinflammatory pulmonary fibrosis   . Cardiomegaly   . Polycythemia vera(238.4)   . Chronic hepatitis C without mention of hepatic coma   . Obesity, unspecified   . Other and unspecified hyperlipidemia   . Unspecified asthma(493.90)   . Degenerative disk disease   . Other  premature beats   . First degree atrioventricular block   . Unspecified essential hypertension   . PSVT (paroxysmal supraventricular tachycardia)   . CAD (coronary artery disease)     Nonobstructive, 09/2011 (false positive GXT)  . HTN (hypertension)     Past Surgical History  Procedure Laterality Date  . Cholecystectomy      post  . Abdominal hysterectomy      Bilateral salpingo-oophorectomy  . Sinus surgery with instatrak  1995  . Cardiac catheterization  2008    mild CAD. 50% diagonal stenosis.     History   Social History  . Marital Status: Married    Spouse Name: STEVEN     Number of Children: 4  . Years of Education: N/A   Occupational History  . SELF EMPLOYED    Social History Main Topics  . Smoking status: Never Smoker   . Smokeless tobacco: Never Used  . Alcohol Use: No  . Drug Use: No  . Sexual Activity: Not on file   Other Topics Concern  . Not on file   Social History Narrative  . No narrative on file    Family History  Problem Relation Age of Onset  . Stroke Father 40  . Coronary artery disease Mother     ROS: no nausea, vomiting; no fever, chills; no melena, hematochezia; no claudication  PHYSICAL EXAM: BP 128/86  Pulse 99  Ht 5\' 6"  (1.676 m)  Wt 209 lb (94.802 kg)  BMI 33.75 kg/m2 GENERAL: 52 year old female; NAD  HEENT: NCAT, PERRLA, EOMI; sclera clear; no xanthelasma  NECK: no JVD; no TM  LUNGS: CTA bilaterally  CARDIAC: RRR (S1, S2); no significant murmurs; no rubs or gallops  ABDOMEN: soft, non-tender; intact BS  EXTREMETIES: no significant peripheral edema  SKIN: warm/dry; no obvious rash/lesions  MUSCULOSKELETAL: no joint deformity  NEURO: no focal deficit; NL affect    EKG: reviewed and available in Electronic Records   ASSESSMENT & PLAN:  PSVT (paroxysmal supraventricular tachycardia) She returns today suggesting further improvement of her palpitations, in both frequency and duration, since the recent increase in  diltiazem dose. At time of last OV, I recommended stopping Toprol, in favor of continued treatment with calcium channel blocker, secondary to underlying asthma.  Chest pain Recent episodes of CP are quite atypical. I reviewed her history, notable for 2 prior cardiac catheterizations, most recently October 2012, and each preceded by a false positive GXT. Therefore, I do not recommend a repeat stress test at this time; however, we will continue to monitor closely for any development of exertional chest pain. Of note, I did consider adding low-dose ASA; however, she has a documented allergy to this. She also declined treatment with Plavix, citing ongoing evaluation for recent gross hematuria, due to a benign bladder lesion.  HTN (hypertension) Well-controlled on current medication regimen    Gene Imogene Gravelle, PAC

## 2013-07-28 NOTE — Assessment & Plan Note (Signed)
Recent episodes of CP are quite atypical. I reviewed her history, notable for 2 prior cardiac catheterizations, most recently October 2012, and each preceded by a false positive GXT. Therefore, I do not recommend a repeat stress test at this time; however, we will continue to monitor closely for any development of exertional chest pain. Of note, I did consider adding low-dose ASA; however, she has a documented allergy to this. She also declined treatment with Plavix, citing ongoing evaluation for recent gross hematuria, due to a benign bladder lesion.

## 2013-07-28 NOTE — Assessment & Plan Note (Signed)
She returns today suggesting further improvement of her palpitations, in both frequency and duration, since the recent increase in diltiazem dose. At time of last OV, I recommended stopping Toprol, in favor of continued treatment with calcium channel blocker, secondary to underlying asthma.

## 2013-07-28 NOTE — Assessment & Plan Note (Signed)
Well-controlled on current medication regimen 

## 2014-02-12 ENCOUNTER — Ambulatory Visit (INDEPENDENT_AMBULATORY_CARE_PROVIDER_SITE_OTHER): Payer: 59 | Admitting: Cardiovascular Disease

## 2014-02-12 ENCOUNTER — Encounter: Payer: Self-pay | Admitting: Cardiovascular Disease

## 2014-02-12 VITALS — BP 165/98 | HR 71 | Ht 66.0 in | Wt 192.0 lb

## 2014-02-12 DIAGNOSIS — I2510 Atherosclerotic heart disease of native coronary artery without angina pectoris: Secondary | ICD-10-CM

## 2014-02-12 DIAGNOSIS — I10 Essential (primary) hypertension: Secondary | ICD-10-CM

## 2014-02-12 DIAGNOSIS — I471 Supraventricular tachycardia: Secondary | ICD-10-CM

## 2014-02-12 DIAGNOSIS — M779 Enthesopathy, unspecified: Secondary | ICD-10-CM

## 2014-02-12 NOTE — Patient Instructions (Signed)
Continue all current medications. Your physician wants you to follow up in: 6 months.  You will receive a reminder letter in the mail one-two months in advance.  If you don't receive a letter, please call our office to schedule the follow up appointment   

## 2014-02-12 NOTE — Progress Notes (Signed)
Patient ID: Connie Ho, female   DOB: [birth year], 53 y.o.   MRN: [id number]      SUBJECTIVE: The patient is a 53 year old woman was here to followup for paroxysmal supraventricular tachycardia. She has a history of hypertension and nonobstructive coronary artery disease. She also has a history of asthma and has been treated for PSVT with calcium channel blockers for this reason. She seldom experiences palpitations, and notices them more at the end of a long day when she feels fatigued. One day last week she was upset and noted her heart rate was 200 beats per minute but this resolved when she calmed down. She said for the most part, her palpitations are well controlled on the current dose of diltiazem. She denies chest pain and shortness of breath as well as leg swelling. She has been experiencing significant upper extremity tendinitis after starting ciprofloxacin for bladder issues. She has since stopped this medication but continues to have significant pain and tenderness in her bilateral upper extremities.    Allergies  Allergen Reactions  . Peanut-Containing Drug Products   . Aspirin Hives  . Ibuprofen Hives    Current Outpatient Prescriptions  Medication Sig Dispense Refill  . albuterol (PROVENTIL) (2.5 MG/3ML) 0.083% nebulizer solution Take 2.5 mg by nebulization every 6 (six) hours as needed for wheezing.      . diltiazem (TIAZAC) 120 MG 24 hr capsule Take 1 capsule (120 mg total) by mouth 2 (two) times daily.  60 capsule  6  . HYDROcodone-acetaminophen (NORCO/VICODIN) 5-325 MG per tablet Take 1 tablet by mouth every 6 (six) hours as needed.       . hydrOXYzine (ATARAX/VISTARIL) 25 MG tablet Take 25 mg by mouth at bedtime.      . pantoprazole (PROTONIX) 40 MG tablet Take 40 mg by mouth 2 (two) times daily.       . predniSONE (DELTASONE) 10 MG tablet Take 10 mg by mouth 2 (two) times daily.      . VENTOLIN HFA 108 (90 BASE) MCG/ACT inhaler Inhale 2 puffs into the lungs every 6  (six) hours as needed.        No current facility-administered medications for this visit.    Past Medical History  Diagnosis Date  . Chest pain, unspecified 2008    Cardiac Catheterization   . Palpitations   . Pure hypercholesterolemia   . Unspecified asthma(493.90)   . Postinflammatory pulmonary fibrosis   . Cardiomegaly   . Polycythemia vera(238.4)   . Chronic hepatitis C without mention of hepatic coma   . Obesity, unspecified   . Other and unspecified hyperlipidemia   . Unspecified asthma(493.90)   . Degenerative disk disease   . Other premature beats   . First degree atrioventricular block   . Unspecified essential hypertension   . PSVT (paroxysmal supraventricular tachycardia)   . CAD (coronary artery disease)     Nonobstructive, 09/2011 (false positive GXT)  . HTN (hypertension)     Past Surgical History  Procedure Laterality Date  . Cholecystectomy      post  . Abdominal hysterectomy      Bilateral salpingo-oophorectomy  . Sinus surgery with instatrak  1995  . Cardiac catheterization  2008    mild CAD. 50% diagonal stenosis.     History   Social History  . Marital Status: Married    Spouse Name: STEVEN     Number of Children: 4  . Years of Education: N/A   Occupational History  . SELF EMPLOYED  Social History Main Topics  . Smoking status: Never Smoker   . Smokeless tobacco: Never Used  . Alcohol Use: No  . Drug Use: No  . Sexual Activity: Not on file   Other Topics Concern  . Not on file   Social History Narrative  . No narrative on file     Filed Vitals:   02/12/14 1026  BP: 165/98  Pulse: 71  Height: 5\' 6"  (1.676 m)  Weight: 192 lb (87.091 kg)  SpO2: 97%    PHYSICAL EXAM General: NAD Neck: No JVD, no thyromegaly. Lungs: Clear to auscultation bilaterally with normal respiratory effort. CV: Nondisplaced PMI.  Regular rate and rhythm, normal S1/S2, no S3/S4, no murmur. No pretibial or periankle edema.  No carotid bruit.  Normal  pedal pulses.  Abdomen: Soft, nontender, no hepatosplenomegaly, no distention.  Neurologic: Alert and oriented x 3.  Psych: Normal affect. Extremities: No clubbing or cyanosis.   ECG: reviewed and available in electronic records.   Coronary angiography (10-07-2011): STUDY CONCLUSIONS:  1. No significant coronary artery disease. There is a 60% ostial  lesion in a small diagonal branch which is less than 2 mm in  diameter.  2. Normal LV systolic function.  3. Moderately elevated systemic pressure and mildly elevated left  ventricular end-diastolic pressure.   RECOMMENDATIONS: Medical therapy is advised. No revascularization is  indicated. The diagonal branch is medium in size and the diameter is  about 1.5 mm. No angioplasty to this location is recommended.    ASSESSMENT AND PLAN:  PSVT (paroxysmal supraventricular tachycardia)  Symptomatically stable. Continue diltiazem 120 twice a day. Will continue to treat PSVT with a calcium channel blocker, given her underlying asthma.   CAD (coronary artery disease)  Nonobstructive in nature. No further workup indicated. Patient reports no CP, and had a normal echocardiogram with only significant disease in a small diagonal branch in 09/2011.  Hypertension It is elevated today. However, she has been experiencing significant upper extremity tendinitis since starting ciprofloxacin. She has since discontinued this. Whenever she has her blood pressure checked, it gives her significant pain. Once her tendinitis is resolved, if she continues to have elevated blood pressures, I would start lisinopril 5 mg daily.  Dispo: f/u 6 months.  Kate Sable, M.D., F.A.C.C.

## 2014-03-14 ENCOUNTER — Encounter: Payer: Self-pay | Admitting: Neurology

## 2014-03-14 ENCOUNTER — Ambulatory Visit (INDEPENDENT_AMBULATORY_CARE_PROVIDER_SITE_OTHER): Payer: 59 | Admitting: Neurology

## 2014-03-14 VITALS — BP 160/90 | HR 86 | Temp 98.1°F | Ht 66.0 in | Wt 197.8 lb

## 2014-03-14 DIAGNOSIS — M5412 Radiculopathy, cervical region: Secondary | ICD-10-CM

## 2014-03-14 DIAGNOSIS — R209 Unspecified disturbances of skin sensation: Secondary | ICD-10-CM

## 2014-03-14 LAB — CK: Total CK: 67 U/L (ref 7–177)

## 2014-03-14 MED ORDER — GABAPENTIN 300 MG PO CAPS: ORAL_CAPSULE | ORAL | Status: DC

## 2014-03-14 NOTE — Progress Notes (Signed)
[facility] Clinic Note - Initial Visit   Date: 03/14/2014    Connie Ho MRN: [id number] DOB: 1961/01/22   Dear Dr Woody Seller:  Thank you for your kind referral of Connie Ho for consultation of bilateral arm pain. Although her history is well known to you, please allow [place] to reiterate it for the purpose of our medical record. The patient was accompanied to the clinic by self.   History of Present Illness: Connie Ho is a 53 y.o. right-handed Caucasian female with history of CAD, hyperlipidemia, migraines, SVT, asthma, hypertension, hepatitis C, hematuria, and polycythemia vera presenting for evaluation of bilateral arm pain.  Starting in December 2014, she developed constant right arm burning over the elbow and shoulder and tingling of the last two fingers.  She was given steroid injections for possible tendonitis, but it did not help.  Within a few weeks, she developed left sided symptoms.  She has soreness of her neck.  She saw orthopaedic surgery who administered an injection in her elbow without any benefit.  She also saw a rheumatology who did not find any autoimmune process.  She finds that extending her arms above her head, it is more comfortable.  Symptoms are worse with elbow flexion and lifting objects, as well as and palpation of the muscles.  She has mild left hand weakness and has been dropping things.   She was given a trial of prednisone, Lyrica 94m BID, and hydrocodone with no improvement.     has previously seen rheumatology because there was no improvement with steroids.   Of note, over the last 639-monthshe has been having hematuria and was started on ciprofloxacin.    She started having bifrontal headaches over the past several weeks, described as pressure like sensation.  She does not endorse photophobia and phonobhobia.  Her blood pressure has been elevated lately because of her pain and attributes it to this.    Past  Medical History  Diagnosis Date  . Chest pain, unspecified 2008    Cardiac Catheterization   . Palpitations   . Pure hypercholesterolemia   . Unspecified asthma(493.90)   . Postinflammatory pulmonary fibrosis   . Cardiomegaly   . Polycythemia vera(238.4)   . Chronic hepatitis C without mention of hepatic coma   . Obesity, unspecified   . Other and unspecified hyperlipidemia   . Unspecified asthma(493.90)   . Degenerative disk disease   . Other premature beats   . First degree atrioventricular block   . Unspecified essential hypertension   . PSVT (paroxysmal supraventricular tachycardia)   . CAD (coronary artery disease)     Nonobstructive, 09/2011 (false positive GXT)  . HTN (hypertension)     Past Surgical History  Procedure Laterality Date  . Cholecystectomy      post  . Abdominal hysterectomy      Bilateral salpingo-oophorectomy  . Sinus surgery with instatrak  1995  . Cardiac catheterization  2008    mild CAD. 50% diagonal stenosis.      Medications:  Current Outpatient Prescriptions on File Prior to Visit  Medication Sig Dispense Refill  . albuterol (PROVENTIL) (2.5 MG/3ML) 0.083% nebulizer solution Take 2.5 mg by nebulization every 6 (six) hours as needed for wheezing.      . diltiazem (TIAZAC) 120 MG 24 hr capsule Take 1 capsule (120 mg total) by mouth 2 (two) times daily.  60 capsule  6  . HYDROcodone-acetaminophen (NORCO/VICODIN) 5-325 MG per tablet Take 1 tablet by  mouth every 6 (six) hours as needed.       . hydrOXYzine (ATARAX/VISTARIL) 25 MG tablet Take 25 mg by mouth at bedtime.      . pantoprazole (PROTONIX) 40 MG tablet Take 40 mg by mouth 2 (two) times daily.       . predniSONE (DELTASONE) 10 MG tablet Take 10 mg by mouth 2 (two) times daily.      . VENTOLIN HFA 108 (90 BASE) MCG/ACT inhaler Inhale 2 puffs into the lungs every 6 (six) hours as needed.        No current facility-administered medications on file prior to visit.    Allergies:    Allergies  Allergen Reactions  . Peanut-Containing Drug Products   . Aspirin Hives  . Ibuprofen Hives    Family History: Family History  Problem Relation Age of Onset  . Stroke Father 30  . Coronary artery disease Mother     Social History: History   Social History  . Marital Status: Married    Spouse Name: STEVEN     Number of Children: 4  . Years of Education: N/A   Occupational History  . SELF EMPLOYED    Social History Main Topics  . Smoking status: Never Smoker   . Smokeless tobacco: Never Used  . Alcohol Use: No  . Drug Use: No  . Sexual Activity: Not on file   Other Topics Concern  . Not on file   Social History Narrative  . No narrative on file    Review of Systems:  CONSTITUTIONAL: No fevers, chills, night sweats, or weight loss.   EYES: No visual changes or eye pain ENT: No hearing changes.  No history of nose bleeds.   RESPIRATORY: No cough, wheezing and shortness of breath.   CARDIOVASCULAR: Negative for chest pain, and palpitations.   GI: Negative for abdominal discomfort, blood in stools or black stools.  No recent change in bowel habits.   GU:  No history of incontinence.   MUSCLOSKELETAL: +history of joint pain or swelling.  +myalgias.   SKIN: Negative for lesions, rash, and itching.   HEMATOLOGY/ONCOLOGY: Negative for prolonged bleeding, bruising easily, and swollen nodes.   ENDOCRINE: Negative for cold or heat intolerance, polydipsia or goiter.   PSYCH:  No depression or anxiety symptoms.   NEURO: As Above.   Vital Signs:  BP 160/90  Pulse 86  Temp(Src) 98.1 F (36.7 C) (Oral)  Ht 5' 6"  (1.676 m)  Wt 197 lb 12.8 oz (89.721 kg)  BMI 31.94 kg/m2  Neurological Exam: MENTAL STATUS including orientation to time, place, person, recent and remote memory, attention span and concentration, language, and fund of knowledge is normal.  Speech is not dysarthric.  CRANIAL NERVES: II:  No visual field defects.  Unremarkable fundi.    III-IV-VI: Pupils equal round and reactive to light.  Normal conjugate, extra-ocular eye movements in all directions of gaze.  No nystagmus.    V:  Normal facial sensation.   VII:  Normal facial symmetry and movements.   VIII:  Normal hearing and vestibular function.   IX-X:  Normal palatal movement.   XI:  Normal shoulder shrug and head rotation.   XII:  Normal tongue strength and range of motion, no deviation or fasciculation.  MOTOR:   No atrophy, fasciculations or abnormal movements.  No pronator drift.  Tone is normal.  There is muscle soreness to palpation over the cervical muscles, upper arm and forearm bilaterally.  Lhermittes sign is negative.  Right Upper Extremity:    Left Upper Extremity:    Deltoid  5/5   Deltoid  5/5   Biceps  5/5   Biceps  5/5   Triceps  5/5   Triceps  5/5   Wrist extensors  5/5   Wrist extensors  5/5   Wrist flexors  5/5   Wrist flexors  5/5   Finger extensors  5/5   Finger extensors  5/5   Finger flexors  5/5   Finger flexors  5/5   Dorsal interossei  5/5   Dorsal interossei  5/5   Abductor pollicis  5/5   Abductor pollicis  5/5   Tone (Ashworth scale)  0  Tone (Ashworth scale)  0   Right Lower Extremity:    Left Lower Extremity:    Hip flexors  5/5   Hip flexors  5/5   Hip extensors  5/5   Hip extensors  5/5   Knee flexors  5/5   Knee flexors  5/5   Knee extensors  5/5   Knee extensors  5/5   Dorsiflexors  5/5   Dorsiflexors  5/5   Plantarflexors  5/5   Plantarflexors  5/5   Toe extensors  5/5   Toe extensors  5/5   Toe flexors  5/5   Toe flexors  5/5   Tone (Ashworth scale)  0  Tone (Ashworth scale)  0   MSRs:  Right                                                                 Left brachioradialis 2+  brachioradialis 2+  biceps 2+  biceps 2+  triceps 2+  triceps 2+  patellar 2+  patellar 2+  ankle jerk 1+  ankle jerk 1+  Hoffman no  Hoffman no  plantar response down  plantar response down   SENSORY:  Reduced pin prick and vibration  distal to ankles bilaterally.    COORDINATION/GAIT: Normal finger-to- nose-finger and heel-to-shin.  Intact rapid alternating movements bilaterally.  Able to rise from a chair without using arms.  Gait narrow based and stable, poor arm swing bilaterally.   IMPRESSION: Connie Ho is a 53 year-old female presenting for evaluation of bilateral arm dysesthesias.  Exam of the upper extremities does not disclose any apparent weakness, sensory loss, or abnormalities in reflexes.  There is, however, pain with muscle palpation over the arms and neck.  I will order MRI cervical spine to look for cervical radiculopathy and EMG to better characterize whether symptoms are neuropathic or myopathic in origin.  Myalgias can be seen in radiculopathy, but for completeness I will check CK, aldolase, and ESR to make sure this is not an inflammatory myopathy in addition to a few neuropathy labs.   She also has diminished sensation to pin prick and vibration in the feet which may be due to early peripheral neuropathy.     PLAN/RECOMMENDATIONS:  1.  Check CK, aldolase, vitamin B12, copper, zinc, ESR 2.  EMG of the left  > right arms 3.  MRI of the cervical spine without contrast 4.  EMG of the left > right 5.  Start neurontin 376m at bedtime x 3 days, then increase to one tablet twice daily 6.  Return to clinic  in 6-weeks   The duration of this appointment visit was 50 minutes of face-to-face time with the patient.  Greater than 50% of this time was spent in counseling, explanation of diagnosis, planning of further management, and coordination of care.   Thank you for allowing me to participate in patient's care.  If I can answer any additional questions, I would be pleased to do so.    Sincerely,    Doctor Sheahan K. Posey Pronto, DO

## 2014-03-14 NOTE — Patient Instructions (Addendum)
1.  Check blood work 2.  MRI of the cervical spine without contrast 3.  EMG of the left > right 4.  Start neurontin 300mg  at bedtime x 3 days, then increase to one tablet twice daily 5.  Return to clinic in 6-weeks

## 2014-03-15 ENCOUNTER — Ambulatory Visit: Payer: 59 | Admitting: Neurology

## 2014-03-15 LAB — VITAMIN B12: Vitamin B-12: 399 pg/mL (ref 211–911)

## 2014-03-15 NOTE — Progress Notes (Signed)
Note faxed.

## 2014-03-16 LAB — ALDOLASE: Aldolase: 4.6 U/L (ref <=8.1)

## 2014-03-16 LAB — COPPER, SERUM: COPPER: 78 ug/dL (ref 70–175)

## 2014-03-16 LAB — ZINC: ZINC: 83 ug/dL (ref 60–130)

## 2014-03-28 ENCOUNTER — Ambulatory Visit: Payer: 59 | Admitting: Neurology

## 2014-04-03 ENCOUNTER — Ambulatory Visit (HOSPITAL_COMMUNITY)
Admission: RE | Admit: 2014-04-03 | Discharge: 2014-04-03 | Disposition: A | Discharge status: Home / Self Care | Payer: 59 | Source: Ambulatory Visit | Attending: Neurology | Admitting: Neurology

## 2014-04-03 DIAGNOSIS — M5030 Other cervical disc degeneration, unspecified cervical region: Secondary | ICD-10-CM | POA: Insufficient documentation

## 2014-04-03 DIAGNOSIS — M5412 Radiculopathy, cervical region: Secondary | ICD-10-CM | POA: Insufficient documentation

## 2014-04-03 DIAGNOSIS — R209 Unspecified disturbances of skin sensation: Secondary | ICD-10-CM

## 2014-04-03 DIAGNOSIS — M4802 Spinal stenosis, cervical region: Secondary | ICD-10-CM | POA: Insufficient documentation

## 2014-04-03 DIAGNOSIS — M47812 Spondylosis without myelopathy or radiculopathy, cervical region: Secondary | ICD-10-CM | POA: Insufficient documentation

## 2014-04-06 ENCOUNTER — Telehealth: Payer: Self-pay | Admitting: Neurology

## 2014-04-06 NOTE — Telephone Encounter (Signed)
Work-up is still in process since EMG is still pending. If she is tolerating neurontin, ok to increase to one tablet three times daily.  Clint Biello K. Anistyn Graddy, DO    Rendon Posey Pronto, DO

## 2014-04-06 NOTE — Telephone Encounter (Signed)
Please advise about results.  Thank you.

## 2014-04-06 NOTE — Telephone Encounter (Signed)
Gave patient results per Dr. Carles Collet.  She said that she is still having pain in her left arm and her fingers are tingling.  I instructed her to call for a sooner appointment if necessary.  Do you need me to tell her anything else?

## 2014-04-06 NOTE — Telephone Encounter (Signed)
Generally it looked stable when compared to 2008 MRI c-spine.  I will let Dr. Posey Pronto go into any further details if necessary but I don't see anything bad.

## 2014-04-06 NOTE — Telephone Encounter (Signed)
No, I think that Dr. Posey Pronto will be back Monday and she can see if there is anything more from her standpoint.

## 2014-04-06 NOTE — Telephone Encounter (Signed)
Pt had her MRI on 04/03/14 and she is having pain and would like to know the results of her MRI.  Please call pt.

## 2014-04-09 NOTE — Telephone Encounter (Signed)
See next note

## 2014-04-09 NOTE — Telephone Encounter (Signed)
Patient called me back and said that both arms are hurting now.  I instructed her to start the neurontin tid per Dr. Posey Pronto and she said that she would try it even though it makes her sleepy.

## 2014-04-09 NOTE — Telephone Encounter (Signed)
LM for patient to call me back. 

## 2014-04-20 ENCOUNTER — Other Ambulatory Visit: Payer: Self-pay | Admitting: *Deleted

## 2014-04-20 DIAGNOSIS — M79601 Pain in right arm: Secondary | ICD-10-CM

## 2014-04-20 DIAGNOSIS — M79602 Pain in left arm: Principal | ICD-10-CM

## 2014-04-26 ENCOUNTER — Encounter: Payer: Self-pay | Admitting: Neurology

## 2014-04-26 ENCOUNTER — Ambulatory Visit (INDEPENDENT_AMBULATORY_CARE_PROVIDER_SITE_OTHER): Payer: 59 | Admitting: Neurology

## 2014-04-26 DIAGNOSIS — M79609 Pain in unspecified limb: Secondary | ICD-10-CM

## 2014-04-26 DIAGNOSIS — G5600 Carpal tunnel syndrome, unspecified upper limb: Secondary | ICD-10-CM

## 2014-04-26 DIAGNOSIS — M79601 Pain in right arm: Secondary | ICD-10-CM

## 2014-04-26 DIAGNOSIS — M79602 Pain in left arm: Principal | ICD-10-CM

## 2014-04-26 DIAGNOSIS — M5412 Radiculopathy, cervical region: Secondary | ICD-10-CM

## 2014-04-26 NOTE — Progress Notes (Signed)
See procedure note for EMG results.  Sukanya Goldblatt K. Anterio Scheel, DO  

## 2014-04-26 NOTE — Procedures (Signed)
[facility]  [place], [address]  [address] [place], [place] [address] Tel: [phone] Fax:  [phone] Test Date:  04/26/2014  Patient: Connie Ho DOB: 08/24/1961 Physician: Narda Amber, DO  Sex: Female Height: 5\' 6"  Ref Phys: Narda Amber  ID#: [id number] Temp: 32.2C Technician:    Patient Complaints: This is a 53 year-old female presenting with bilateral arm dysesthesias.  NCV & EMG Findings: Extensive evaluation of the right upper extremity and additional studies of the left reveals:  1. Normal median, ulnar, and radial sensory responses. Abnormal palmar study on the right side. The left median sensory and palmar studies are normal.  2. Bilateral median motor studies are normal. The right ulnar motor study is also normal.  3. Chronic motor axonal loss changes are seen affecting the pronator teres, biceps, and deltoid muscles bilaterally. There is no evidence of active denervation.   Impression: 1. There is electrophysiological evidence of an old C6 intraspinal canal lesion (i.e. radiculopathy) affecting the upper extremities bilaterally.  Overall, these findings are mild to moderate in degree electrically. 2. There is also evidence of a mild median neuropathy at or distal to the wrist affecting the right side, consistent with the clinical diagnosis of carpal tunnel syndrome.   ___________________________ Narda Amber, DO    Nerve Conduction Studies Anti Sensory Summary Table   Site NR Peak (ms) Norm Peak (ms) P-T Amp (V) Norm P-T Amp  Left Median Anti Sensory (2nd Digit)  Wrist    3.6 <3.6 46.8 >15  Right Median Anti Sensory (2nd Digit)  Wrist    3.6 <3.6 53.5 >15  Right Radial Anti Sensory (Base 1st Digit)  Wrist    2.4 <2.7 27.0 >14  Right Ulnar Anti Sensory (5th Digit)  Site 2    3.1  33.4    Motor Summary Table   Site NR Onset (ms) Norm Onset (ms) O-P Amp (mV) Norm O-P Amp Site1 Site2 Delta-0 (ms) Dist (cm) Vel (m/s) Norm Vel (m/s)  Left Median  Motor (Abd Poll Brev)  Wrist    3.7 <4.0 8.4 >6 Elbow Wrist 4.3 26.0 60 >50  Elbow    8.0  8.4         Right Median Motor (Abd Poll Brev)  Wrist    3.3 <4.0 6.9 >6 Elbow Wrist 4.4 26.0 59 >50  Elbow    7.7  6.9         Right Ulnar Motor (Abd Dig Minimi)  Wrist    2.5 <3.1 10.6 >7 B Elbow Wrist 3.0 21.5 72 >50  B Elbow    5.5  10.2  A Elbow B Elbow 1.7 0.0  >50  A Elbow    7.2  9.7          Comparison Summary Table   Site NR Peak (ms) Norm Peak (ms) P-T Amp (V) Site1 Site2 Delta-P (ms) Norm Delta (ms)  Left Median/Ulnar Palm Comparison (Wrist - 8cm)  Median Palm    2.2 <2.2 44.3 Median Palm Ulnar Palm 0.3   Ulnar Palm    1.9 <2.2 36.7      Right Median/Ulnar Palm Comparison (Wrist - 8cm)  Median Palm    2.3 <2.2 42.6 Median Palm Ulnar Palm 0.6   Ulnar Palm    1.7 <2.2 23.7       EMG   Side Muscle Ins Act Fibs Psw Fasc Number Recrt Dur Dur. Amp Amp. Poly Poly. Comment  Right 1stDorInt Nml Nml Nml Nml Nml Nml Nml  Nml Nml Nml Nml Nml N/A  Right Abd Poll Brev Nml Nml Nml Nml Nml Nml Nml Nml Nml Nml Nml Nml N/A  Right Ext Indicis Nml Nml Nml Nml Nml Nml Nml Nml Nml Nml Nml Nml N/A  Right PronatorTeres Nml Nml Nml Nml 1- Mod-R Few 1+ Nml Nml Nml Nml N/A  Right Biceps Nml Nml Nml Nml 1- Mod-V Few 1+ Few 1+ Few 1+ N/A  Right Triceps Nml Nml Nml Nml Nml Nml Nml Nml Nml Nml Nml Nml N/A  Right Deltoid Nml Nml Nml Nml 1- Mod-R Few 1+ Nml Nml Nml Nml N/A  Right Infraspinatus Nml Nml Nml Nml Nml Nml Nml Nml Nml Nml Nml Nml N/A  Left 1stDorInt Nml Nml Nml Nml Nml Nml Nml Nml Nml Nml Nml Nml N/A  Left Abd Poll Brev Nml Nml Nml Nml Nml Nml Nml Nml Nml Nml Nml Nml N/A  Left PronatorTeres Nml Nml Nml Nml 1- Mod-R Some 1+ Some 1+ Nml Nml N/A  Left Biceps Nml Nml Nml Nml 1- Rapid Some 1+ Some 1+ Nml Nml N/A  Left Deltoid Nml Nml Nml Nml 1- Mod-R Some 1+ Nml Nml Nml Nml N/A      Waveforms:

## 2014-04-27 ENCOUNTER — Telehealth: Payer: Self-pay | Admitting: Neurology

## 2014-04-27 MED ORDER — GABAPENTIN 100 MG PO CAPS: 100.0000 mg | ORAL_CAPSULE | Freq: Every day | ORAL | Status: DC

## 2014-04-27 NOTE — Telephone Encounter (Signed)
Called with the results of the EMG and discussed that she has very mild carpal tunnel syndrome on the right side and very mild C6 radiculopathy bilaterally. She has pain which is atypical of a radiculopathy and out of proportion than what would be expected.  There is no evidence of myopathy on her EMG. She is already been evaluated by rheumatology in orthopedics with a negative workup. Prednisone as well as a steroid injection to the elbow did not provide any relief.  I have asked her to use a wrist splint and will add extra 100mg  neurontin to the daytime for pain relief.  She is in agreement of plan.  Connie Cecere K. Posey Pronto, DO

## 2014-05-10 ENCOUNTER — Ambulatory Visit (INDEPENDENT_AMBULATORY_CARE_PROVIDER_SITE_OTHER): Payer: 59 | Admitting: Neurology

## 2014-05-10 ENCOUNTER — Encounter: Payer: Self-pay | Admitting: Neurology

## 2014-05-10 ENCOUNTER — Encounter: Payer: Self-pay | Admitting: *Deleted

## 2014-05-10 VITALS — BP 160/102 | HR 74 | Temp 98.0°F | Resp 16 | Ht 66.0 in | Wt 203.4 lb

## 2014-05-10 DIAGNOSIS — G5600 Carpal tunnel syndrome, unspecified upper limb: Secondary | ICD-10-CM

## 2014-05-10 DIAGNOSIS — M779 Enthesopathy, unspecified: Secondary | ICD-10-CM

## 2014-05-10 NOTE — Progress Notes (Signed)
Follow-up Visit   Date: 05/10/2014    Connie Ho MRN: [id number] DOB: [birth year]   Interim History: YOSHIYE Ho is a 53 y.o. right-handed Caucasian female with history of history of CAD, hyperlipidemia, migraines, SVT, asthma, hypertension, hepatitis C, hematuria, and polycythemia vera returning to the clinic for follow-up of bilateral arm pain.  The patient was accompanied to the clinic by daughter who also provides collateral information.    History of present illness: Starting in December 2014, she developed constant right arm burning over the elbow and shoulder and tingling of the last two fingers. She was given steroid injections for possible tendonitis, but it did not help. Within a few weeks, she developed left sided symptoms. She has soreness of her neck. She saw orthopaedic surgery who administered an injection in her elbow without any benefit. She also saw a rheumatology who did not find any autoimmune process. She finds that extending her arms above her head, it is more comfortable. Symptoms are worse with elbow flexion and lifting objects, as well as and palpation of the joints/tendons. She has mild left hand weakness and has been dropping things. She was given a trial of prednisone, Lyrica 75mg  BID, and hydrocodone with no improvement.   Of note, over the last 67-months she has been having hematuria and was started on ciprofloxacin.   - Follow-up 05/10/2014:  She is here to discuss results of testing ordered at last visit.  MRI c-spine does not shows any anormalities to account for her symptoms.  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  She feels that pain started after taking ciprofloxacin and is concerned she may have side effects related to this.  Today, she is not feeling well because of severe bitemporal throbbing pain.  She had transient  loss of peripheral vision of the left eye and has similar spells in the past associated with migraines.  She has a history of migraines which occur infrequently, with the last one several years ago.  She usually lays down, places an ice pack on her neck, turns the lights off, and takes tylenol.  She has associated n[place]asea and vomiting.  Headaches generally last a few days.  Frequency is rare and her last one occurred several years ago.  She was previously on topiramate.      Medications:  Current Outpatient Prescriptions on File Prior to Visit  Medication Sig Dispense Refill  . albuterol (PROVENTIL) (2.5 MG/3ML) 0.083% nebulizer solution Take 2.5 mg by nebulization every 6 (six) hours as needed for wheezing.      . diltiazem (TIAZAC) 120 MG 24 hr capsule Take 1 capsule (120 mg total) by mouth 2 (two) times daily.  60 capsule  6  . ESTRACE VAGINAL 0.1 MG/GM vaginal cream       . gabapentin (NEURONTIN) 100 MG capsule Take 1 capsule (100 mg total) by mouth daily.  30 capsule  3  . gabapentin (NEURONTIN) 300 MG capsule Start taking one tablet at bedtime x 3 days, then increase to twice daily  60 capsule  3  . HYDROcodone-acetaminophen (NORCO/VICODIN) 5-325 MG per tablet Take 1 tablet by mouth every 6 (six) hours as needed.       . hydrOXYzine (ATARAX/VISTARIL) 25 MG tablet Take 25 mg by mouth at bedtime.      . pantoprazole (PROTONIX) 40 MG tablet Take 40 mg by mouth  2 (two) times daily.       . VENTOLIN HFA 108 (90 BASE) MCG/ACT inhaler Inhale 2 puffs into the lungs every 6 (six) hours as needed.        No current facility-administered medications on file prior to visit.    Allergies:  Allergies  Allergen Reactions  . Peanut-Containing Drug Products   . Aspirin Hives  . Ibuprofen Hives     Review of Systems:  CONSTITUTIONAL: No fevers, chills, night sweats, or weight loss.   EYES: No visual changes or eye pain ENT: No hearing changes.  No history of nose bleeds.   RESPIRATORY: No cough,  wheezing and shortness of breath.   CARDIOVASCULAR: Negative for chest pain, and palpitations.   GI: Negative for abdominal discomfort, blood in stools or black stools.  No recent change in bowel habits.   GU:  No history of incontinence.   MUSCLOSKELETAL: ++ history of joint pain or swelling.  + myalgias.   SKIN: Negative for lesions, rash, and itching.   ENDOCRINE: Negative for cold or heat intolerance, polydipsia or goiter.   PSYCH:  No depression or anxiety symptoms.   NEURO: As Above.   Vital Signs:  BP 160/102  Pulse 74  Temp(Src) 98 F (36.7 C)  Resp 16  Ht 5\' 6"  (1.676 m)  Wt 203 lb 6.4 oz (92.262 kg)  BMI 32.85 kg/m2  SpO2 94%  Neurological Exam: MENTAL STATUS including orientation to time, place, person, recent and remote memory, attention span and concentration, language, and fund of knowledge is normal.  Speech is not dysarthric.  CRANIAL NERVES: No visual field defects.  Pupils equal round and reactive to light.  Normal conjugate, extra-ocular eye movements in all directions of gaze.  No ptosis. Normal facial sensation.  Face is symmetric. Palate elevates symmetrically.  Tongue is midline.  MOTOR:  Motor strength is 5/5 in all extremities.  No atrophy, fasciculations or abnormal movements.  No pronator drift.  Tone is normal.    MSRs:  Reflexes are 2+/4 throughout, except 1+ at the Achilles bilaterally.  SENSORY:  Reduced pin prick and vibration distal to ankles bilaterally.   COORDINATION/GAIT:  Normal finger-to- nose-finger and heel-to-shin.  Intact rapid alternating movements bilaterally.  Gait narrow based and stable.   Data:* Labs 03/14/2014:  CK 67, aldolase 4.6, vitamin B12 399, copper 78, zinc 83  MRI cervical spine 03/14/2014: 1. Stable mild spondylosis with facet hypertrophy and uncinate spurring contributing to mild foraminal stenosis as detailed above. There is no cord deformity.  2. No acute findings identified.  NCS/EMG 04/26/2014: Extensive  evaluation of the right upper extremity and additional studies of the left reveals:  1. Normal median, ulnar, and radial sensory responses. Abnormal palmar study on the right side. The left median sensory and palmar studies are normal.  2. Bilateral median motor studies are normal. The right ulnar motor study is also normal.  3. Chronic motor axonal loss changes are seen affecting the pronator teres, biceps, and deltoid muscles bilaterally. There is no evidence of active denervation.  Impression:  1. There is electrophysiological evidence of an old C6 intraspinal canal lesion (i.e. radiculopathy) affecting the upper extremities bilaterally. Overall, these findings are mild to moderate in degree electrically. 2. There is also evidence of a mild median neuropathy at or distal to the wrist affecting the right side, consistent with the clinical diagnosis of carpal tunnel syndrome.   IMPRESSION/PLAN: Mrs. Axelrod is a 53 year-old female returning for evaluation of bilateral arm  pain.  There is no focal deficits on neurological exam.  There is very mild C6 radiculopathy bilaterally and right carpal tunnel syndrome on EMG, however, I do not feel that she is symptomatic from this given that her pain  are out of proportion than what is seen electrodiagnostically.    She has tenderness over the tendons of the upper extremities (biceps, brachioradialis, trieps, medial pecoralis, and right Achilles) which is more suggestive of tendonitis.  She was taking ciprofloxiacin for hematuria and quinolones are known to have the potential side effect of tendonitis which may be very possible in her case.  Unfortunately, she does not tolerate NSAIDs and had no relief with steroid injections.  Since I cannot find any neurological etiology for her arm pain, I will refer the patient to Dr. Hulan Saas for musculoskeletal ultrasound to assess for tendonitis.  Regarding her headache, she can use tylenol. Toradol offered for  pain relief, but patient declined.   The duration of this appointment visit was 30 minutes of face-to-face time with the patient.  Greater than 50% of this time was spent in counseling, explanation of diagnosis, planning of further management, and coordination of care.   Thank you for allowing me to participate in patient's care.  If I can answer any additional questions, I would be pleased to do so.    Sincerely,    Donika K. Posey Pronto, DO

## 2014-05-10 NOTE — Patient Instructions (Signed)
1. Referral to Dr. Hulan Saas for evaluation of possible medication induced tendonitis 2. Use tylenol for headaches

## 2014-05-25 ENCOUNTER — Ambulatory Visit (INDEPENDENT_AMBULATORY_CARE_PROVIDER_SITE_OTHER): Payer: 59

## 2014-05-25 ENCOUNTER — Ambulatory Visit (INDEPENDENT_AMBULATORY_CARE_PROVIDER_SITE_OTHER): Payer: 59 | Admitting: Family Medicine

## 2014-05-25 ENCOUNTER — Encounter: Payer: Self-pay | Admitting: Family Medicine

## 2014-05-25 VITALS — BP 144/90 | HR 73 | Ht 66.0 in | Wt 252.0 lb

## 2014-05-25 DIAGNOSIS — M79609 Pain in unspecified limb: Secondary | ICD-10-CM

## 2014-05-25 DIAGNOSIS — M79601 Pain in right arm: Secondary | ICD-10-CM

## 2014-05-25 DIAGNOSIS — M109 Gout, unspecified: Secondary | ICD-10-CM

## 2014-05-25 DIAGNOSIS — M7710 Lateral epicondylitis, unspecified elbow: Secondary | ICD-10-CM

## 2014-05-25 DIAGNOSIS — M25529 Pain in unspecified elbow: Secondary | ICD-10-CM | POA: Insufficient documentation

## 2014-05-25 MED ORDER — DICLOFENAC SODIUM 2 % TD SOLN: 2.0000 "application " | Freq: Two times a day (BID) | TRANSDERMAL | Status: DC

## 2014-05-25 MED ORDER — ALLOPURINOL 100 MG PO TABS: 100.0000 mg | ORAL_TABLET | Freq: Every day | ORAL | Status: DC

## 2014-05-25 MED ORDER — COLCHICINE 0.6 MG PO TABS: 0.6000 mg | ORAL_TABLET | Freq: Two times a day (BID) | ORAL | Status: DC

## 2014-05-25 MED ORDER — TRAMADOL HCL 50 MG PO TABS: 50.0000 mg | ORAL_TABLET | Freq: Every evening | ORAL | Status: DC | PRN

## 2014-05-25 NOTE — Assessment & Plan Note (Signed)
Patient does have lateral epicondylitis and does have a tear that is appreciated in the extensor common tendon. Patient was given a wrist brace to avoid any excessive extension over the course of the next couple weeks. Patient is going to wear it day and night. We also discussed icing. Patient will do a topical anti-inflammatory and see if she does not have any true allergies. Patient has any rash he knows to stop the medication immediately. Patient will try these interventions and come back again in 2 weeks.

## 2014-05-25 NOTE — Assessment & Plan Note (Signed)
Patient's ultrasound and her polyarthralgia does correspond very well with a possible increase uric acid level. Patient does not have any warmness of the joint so I do not think she is an acute flare. Patient does have a strong family history for gout. We will treat as such with colchicine short term as well as allopurinol. Patient will try these medications and we'll see if she improves. We discussed the possibility of getting more labs but patient feels that too many imaging tests and labs are to be done. Also I do not think it would change our management. Patient will try this and come back again in 2 weeks. We may need to increase patient's allopurinol dose at that time.

## 2014-05-25 NOTE — Patient Instructions (Addendum)
Very nice to meet you Wear the brace day and night for next 2 weeks then at night for 2 weeks.  Pennsaid twice daily.  Colchicine 2 times daily for 5 days Allopurinol daily will decrease uric acid levels.  Ice can help if you put it in the area 20 minutes.  No exercises until I see you again.  Tramadol at nigh only.  Come back in 2 weeks.   Gout Gout is an inflammatory arthritis caused by a buildup of uric acid crystals in the joints. Uric acid is a chemical that is normally present in the blood. When the level of uric acid in the blood is too high it can form crystals that deposit in your joints and tissues. This causes joint redness, soreness, and swelling (inflammation). Repeat attacks are common. Over time, uric acid crystals can form into masses (tophi) near a joint, destroying bone and causing disfigurement. Gout is treatable and often preventable. CAUSES  The disease begins with elevated levels of uric acid in the blood. Uric acid is produced by your body when it breaks down a naturally found substance called purines. Certain foods you eat, such as meats and fish, contain high amounts of purines. Causes of an elevated uric acid level include:  Being passed down from parent to child (heredity).  Diseases that cause increased uric acid production (such as obesity, psoriasis, and certain cancers).  Excessive alcohol use.  Diet, especially diets rich in meat and seafood.  Medicines, including certain cancer-fighting medicines (chemotherapy), water pills (diuretics), and aspirin.  Chronic kidney disease. The kidneys are no longer able to remove uric acid well.  Problems with metabolism. Conditions strongly associated with gout include:  Obesity.  High blood pressure.  High cholesterol.  Diabetes. Not everyone with elevated uric acid levels gets gout. It is not understood why some people get gout and others do not. Surgery, joint injury, and eating too much of certain foods are  some of the factors that can lead to gout attacks. SYMPTOMS   An attack of gout comes on quickly. It causes intense pain with redness, swelling, and warmth in a joint.  Fever can occur.  Often, only one joint is involved. Certain joints are more commonly involved:  Base of the big toe.  Knee.  Ankle.  Wrist.  Finger. Without treatment, an attack usually goes away in a few days to weeks. Between attacks, you usually will not have symptoms, which is different from many other forms of arthritis. DIAGNOSIS  Your caregiver will suspect gout based on your symptoms and exam. In some cases, tests may be recommended. The tests may include:  Blood tests.  Urine tests.  X-rays.  Joint fluid exam. This exam requires a needle to remove fluid from the joint (arthrocentesis). Using a microscope, gout is confirmed when uric acid crystals are seen in the joint fluid. TREATMENT  There are two phases to gout treatment: treating the sudden onset (acute) attack and preventing attacks (prophylaxis).  Treatment of an Acute Attack.  Medicines are used. These include anti-inflammatory medicines or steroid medicines.  An injection of steroid medicine into the affected joint is sometimes necessary.  The painful joint is rested. Movement can worsen the arthritis.  You may use warm or cold treatments on painful joints, depending which works best for you.  Treatment to Prevent Attacks.  If you suffer from frequent gout attacks, your caregiver may advise preventive medicine. These medicines are started after the acute attack subsides. These medicines either help  your kidneys eliminate uric acid from your body or decrease your uric acid production. You may need to stay on these medicines for a very long time.  The early phase of treatment with preventive medicine can be associated with an increase in acute gout attacks. For this reason, during the first few months of treatment, your caregiver may also  advise you to take medicines usually used for acute gout treatment. Be sure you understand your caregiver's directions. Your caregiver may make several adjustments to your medicine dose before these medicines are effective.  Discuss dietary treatment with your caregiver or dietitian. Alcohol and drinks high in sugar and fructose and foods such as meat, poultry, and seafood can increase uric acid levels. Your caregiver or dietician can advise you on drinks and foods that should be limited. HOME CARE INSTRUCTIONS   Do not take aspirin to relieve pain. This raises uric acid levels.  Only take over-the-counter or prescription medicines for pain, discomfort, or fever as directed by your caregiver.  Rest the joint as much as possible. When in bed, keep sheets and blankets off painful areas.  Keep the affected joint raised (elevated).  Apply warm or cold treatments to painful joints. Use of warm or cold treatments depends on which works best for you.  Use crutches if the painful joint is in your leg.  Drink enough fluids to keep your urine clear or pale yellow. This helps your body get rid of uric acid. Limit alcohol, sugary drinks, and fructose drinks.  Follow your dietary instructions. Pay careful attention to the amount of protein you eat. Your daily diet should emphasize fruits, vegetables, whole grains, and fat-free or low-fat milk products. Discuss the use of coffee, vitamin C, and cherries with your caregiver or dietician. These may be helpful in lowering uric acid levels.  Maintain a healthy body weight. SEEK MEDICAL CARE IF:   You develop diarrhea, vomiting, or any side effects from medicines.  You do not feel better in 24 hours, or you are getting worse. SEEK IMMEDIATE MEDICAL CARE IF:   Your joint becomes suddenly more tender, and you have chills or a fever. MAKE SURE YOU:   Understand these instructions.  Will watch your condition.  Will get help right away if you are not  doing well or get worse. Document Released: 11/27/2000 Document Revised: 03/27/2013 Document Reviewed: 07/13/2012 [facility] Patient Information 2014 [place].

## 2014-05-25 NOTE — Progress Notes (Signed)
[facility] [address] [place], [place] [address] Phone: [phone] Subjective:    I'm seeing this patient by the request  of:  Dr. Posey Pronto  CC: Bilateral arm pain and burning.  PZW:[id number] Connie Ho is a 53 y.o. female coming in with complaint of bilateral arm pain. Patient states that this pain did start back in January. Patient states it is more of a dull aching pain of the lateral aspect of the elbow. Patient states it started on the right side and started having some mild pain on the left side. Patient's primary care provider first and was given an injection in the elbow as well as in the shoulder. Patient states that this did not make any improvement. Patient then was put on prednisone and states once again no improvement. Patient was sent to a orthopedic physician who did a another steroid injection in the elbow with very minimal improvement for multiple days and then the pain seemed to be worse. Patient states now she has a constant dull aching pain that can be throbbing in nature mostly over the right lateral aspect of the elbow. This did stop her from activity. Patient was also sent for referral to rheumatology that felt that because patient did not respond the prednisone this was not likely a home unit in nature. Patient was sent also to neurology for further evaluation. Patient was seen and was diagnosed with some mild bilateral carpal tunnel syndrome as well as an MRI showing mild foraminal stenosis at C5-C6. Patient has been put on gabapentin which causes more side effects and does not help the pain. Patient is allergic to anti-inflammatories and has not been able to take any. Patient rates the severity of 9/10 thickening wake her up at night. Patient does work at [employer] and finds it difficult to do her daily activities secondary to this pain. Patient denies any neck pain but states that many of her other joints can have significant  discomfort.      Past medical history, social, surgical and family history all reviewed in electronic medical record.   Review of Systems: No headache, visual changes, nausea, vomiting, diarrhea, constipation, dizziness, abdominal pain, skin rash, fevers, chills, night sweats, weight loss, swollen lymph nodes, body aches, joint swelling, muscle aches, chest pain, shortness of breath, mood changes.   Objective Blood pressure 144/90, pulse 73, height 5\' 6"  (1.676 m), weight 252 lb (114.306 kg), SpO2 97.00%.  General: No apparent distress alert and oriented x3 mood and affect normal, dressed appropriately.  HEENT: Pupils equal, extraocular movements intact  Respiratory: Patient's speak in full sentences and does not appear short of breath  Cardiovascular: No lower extremity edema, non tender, no erythema  Skin: Warm dry intact with no signs of infection or rash on extremities or on axial skeleton.  Abdomen: Soft nontender  Neuro: Cranial nerves II through XII are intact, neurovascularly intact in all extremities with 2+ DTRs and 2+ pulses.  Lymph: No lymphadenopathy of posterior or anterior cervical chain or axillae bilaterally.  Gait normal with good balance and coordination.  MSK:  Non tender with full range of motion and good stability and symmetric strength and tone of shoulders,  wrist, hip, knee and ankles bilaterally.  Elbow: right  Unremarkable to inspection. Range of motion full pronation, supination, flexion, extension. Strength is full to all of the above directions to the contralateral side the patient's participation is minimal. Stable to varus, valgus stress. Negative moving valgus stress  test. Patient does have some generalized tenderness but does have severe tenderness over the lateral epicondylar region Ulnar nerve does not sublux. Negative cubital tunnel Tinel's. Contralateral elbow is moderately tender as well but full range of motion.  Musculoskeletal ultrasound was  performed and interpreted by Charlann Boxer D.O.   Elbow: Right Lateral epicondyle and common extensor tendon shows patient does have significant hypoechoic changes and increasing Doppler flow. Patient likely had a tear previously this seems to be healing. In addition this patient does have what appears to be uric acid or other crystal deposits within the area and the elbow joint itself.  Radial head unremarkable and located in annular ligament Medial epicondyle and common flexor tendon origin visualized.  No edema, effusions, or avulsions seen. Ulnar nerve in cubital tunnel unremarkable. Olecranon and triceps insertion visualized with crystal deposits noted.  No signs olecranon bursitis. Power doppler signal normal.  IMPRESSION:  Lateral epicondylitis and crystal arthropathy.   Impression and Recommendations:     This case required medical decision making of moderate complexity.

## 2014-06-07 ENCOUNTER — Ambulatory Visit (INDEPENDENT_AMBULATORY_CARE_PROVIDER_SITE_OTHER): Payer: 59 | Admitting: Family Medicine

## 2014-06-07 ENCOUNTER — Encounter: Payer: Self-pay | Admitting: Family Medicine

## 2014-06-07 VITALS — BP 130/84 | HR 77 | Ht 66.0 in | Wt 202.0 lb

## 2014-06-07 DIAGNOSIS — M7710 Lateral epicondylitis, unspecified elbow: Secondary | ICD-10-CM

## 2014-06-07 DIAGNOSIS — M109 Gout, unspecified: Secondary | ICD-10-CM

## 2014-06-07 DIAGNOSIS — M7711 Lateral epicondylitis, right elbow: Secondary | ICD-10-CM

## 2014-06-07 MED ORDER — ALLOPURINOL 300 MG PO TABS: 300.0000 mg | ORAL_TABLET | Freq: Every day | ORAL | Status: DC

## 2014-06-07 NOTE — Assessment & Plan Note (Signed)
Discussed with patient at this time. Patient is going to start doing exercises 3 times a week and stop wearing her braces during the day but continue at night for at least the next 2 weeks. We discussed continuing the topical anti-inflammatories as well as the icing. Patient will come back again in 3-4 weeks for further evaluation. Continuing to have pain we'll consider corticosteroid injection.

## 2014-06-07 NOTE — Patient Instructions (Signed)
It is very good to see you Increase allopurinol to 200mg  at night until out.  Then go to the 300mg  tabs.  Continue the exercises 3 times a week Continue the topical gel  Come back in 3-4 weeks.

## 2014-06-07 NOTE — Progress Notes (Signed)
  [name] [place], [place] [address] Phone: [phone] Subjective:      CC: Arm pain followup. Crystal arthropathy  MCN:[id number] Connie Ho is a 53 y.o. female coming in with complaint of bilateral arm pain. Please see previous note about patient's previous workup. Patient is coming in for followup of what was diagnosed as more of a lateral epicondylitis. Patient was put in a brace for a long amount of time, discussed icing protocol and was given home exercise program. Patient was also doing topical anti-inflammatories. 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 Patient states overall she is 20% better. Patient has not been doing any exercises. Patient has not had any side effects of the allopurinol. Patient states all her aches and pains are minimally better. Patient is also attempted to try to do some weight as well as change her diet.     Past medical history, social, surgical and family history all reviewed in electronic medical record.   Review of Systems: No headache, visual changes, nausea, vomiting, diarrhea, constipation, dizziness, abdominal pain, skin rash, fevers, chills, night sweats, weight loss, swollen lymph nodes, body aches, joint swelling, muscle aches, chest pain, shortness of breath, mood changes.   Objective Blood pressure 130/84, pulse 77, height 5\' 6"  (1.676 m), weight 202 lb (91.627 kg), SpO2 97.00%.  General: No apparent distress alert and oriented x3 mood and affect normal, dressed appropriately.  HEENT: Pupils equal, extraocular movements intact  Respiratory: Patient's speak in full sentences and does not appear short of breath  Cardiovascular: No lower extremity edema, non tender, no erythema  Skin: Warm dry intact with no signs of infection or rash on extremities or on axial skeleton.  Abdomen: Soft nontender  Neuro: Cranial nerves II through XII are  intact, neurovascularly intact in all extremities with 2+ DTRs and 2+ pulses.  Lymph: No lymphadenopathy of posterior or anterior cervical chain or axillae bilaterally.  Gait normal with good balance and coordination.  MSK:  Non tender with full range of motion and good stability and symmetric strength and tone of shoulders,  wrist, hip, knee and ankles bilaterally.  Elbow: right  Unremarkable to inspection. Range of motion full pronation, supination, flexion, extension. Strength is full to all of the above directions to the contralateral side the patient's participation is minimal. Stable to varus, valgus stress. Negative moving valgus stress test. Continue generalized tenderness over the elbow mostly over the lateral epicondylar region. Ulnar nerve does not sublux. Negative cubital tunnel Tinel's. Contralateral elbow is moderately tender as well but full range of motion.  Musculoskeletal ultrasound was performed and interpreted by Charlann Boxer D.O.   Elbow: Right Lateral epicondyle and common extensor tendon shows patient does only mild hypoechoic changes. Tear seems completely resolved.crystal deposits are significantly less. Radial head unremarkable and located in annular ligament Medial epicondyle and common flexor tendon origin visualized.  No edema, effusions, or avulsions seen. Ulnar nerve in cubital tunnel unremarkable. Olecranon and triceps insertion visualized with crystal deposits noted.  No signs olecranon bursitis. Power doppler signal normal.  IMPRESSION:  Lateral epicondylitis improved with less crystal formation as well.     Impression and Recommendations:     This case required medical decision making of moderate complexity.

## 2014-06-07 NOTE — Assessment & Plan Note (Signed)
Patient has made some improvement. At this point we will start to increase patient's allopurinol. I like to see her back again in 3-4 weeks. Patient was given exercises as well. In 3-4 weeks which should consider checking labs. If we do also like to rule out hypercalcemia with a complete metabolic panel as well as a parathyroid hormone in case there is another pathology. We discussed the choices again and possible side effects of the medications. Patient and will come back for further evaluation.  Spent greater than 25 minutes with patient face-to-face and had greater than 50% of counseling including as described above in assessment and plan.

## 2014-06-29 ENCOUNTER — Other Ambulatory Visit: Payer: Self-pay | Admitting: Family Medicine

## 2014-06-29 ENCOUNTER — Other Ambulatory Visit (INDEPENDENT_AMBULATORY_CARE_PROVIDER_SITE_OTHER): Payer: 59

## 2014-06-29 ENCOUNTER — Ambulatory Visit (INDEPENDENT_AMBULATORY_CARE_PROVIDER_SITE_OTHER): Payer: 59 | Admitting: Family Medicine

## 2014-06-29 ENCOUNTER — Encounter: Payer: Self-pay | Admitting: Family Medicine

## 2014-06-29 VITALS — BP 132/80 | HR 76 | Temp 98.4°F | Wt 205.5 lb

## 2014-06-29 DIAGNOSIS — M7710 Lateral epicondylitis, unspecified elbow: Secondary | ICD-10-CM

## 2014-06-29 DIAGNOSIS — IMO0001 Reserved for inherently not codable concepts without codable children: Secondary | ICD-10-CM

## 2014-06-29 DIAGNOSIS — M791 Myalgia, unspecified site: Secondary | ICD-10-CM

## 2014-06-29 DIAGNOSIS — M7711 Lateral epicondylitis, right elbow: Secondary | ICD-10-CM

## 2014-06-29 DIAGNOSIS — M109 Gout, unspecified: Secondary | ICD-10-CM

## 2014-06-29 LAB — CBC WITH DIFFERENTIAL/PLATELET
BASOS ABS: 0 10*3/uL (ref 0.0–0.1)
Basophils Relative: 0.4 % (ref 0.0–3.0)
Eosinophils Absolute: 0.2 10*3/uL (ref 0.0–0.7)
Eosinophils Relative: 2.3 % (ref 0.0–5.0)
HEMATOCRIT: 44.7 % (ref 36.0–46.0)
Hemoglobin: 15.4 g/dL — ABNORMAL HIGH (ref 12.0–15.0)
LYMPHS ABS: 2.3 10*3/uL (ref 0.7–4.0)
Lymphocytes Relative: 24.9 % (ref 12.0–46.0)
MCHC: 34.3 g/dL (ref 30.0–36.0)
MCV: 89.3 fl (ref 78.0–100.0)
MONO ABS: 0.6 10*3/uL (ref 0.1–1.0)
MONOS PCT: 6.7 % (ref 3.0–12.0)
NEUTROS ABS: 6 10*3/uL (ref 1.4–7.7)
Neutrophils Relative %: 65.7 % (ref 43.0–77.0)
Platelets: 276 10*3/uL (ref 150.0–400.0)
RBC: 5.01 Mil/uL (ref 3.87–5.11)
RDW: 12.5 % (ref 11.5–15.5)
WBC: 9.1 10*3/uL (ref 4.0–10.5)

## 2014-06-29 LAB — COMPREHENSIVE METABOLIC PANEL
ALT: 35 U/L (ref 0–35)
AST: 34 U/L (ref 0–37)
Albumin: 4.2 g/dL (ref 3.5–5.2)
Alkaline Phosphatase: 86 U/L (ref 39–117)
BILIRUBIN TOTAL: 0.6 mg/dL (ref 0.2–1.2)
BUN: 11 mg/dL (ref 6–23)
CO2: 31 meq/L (ref 19–32)
Calcium: 9.9 mg/dL (ref 8.4–10.5)
Chloride: 98 mEq/L (ref 96–112)
Creatinine, Ser: 0.7 mg/dL (ref 0.4–1.2)
GFR: 99.71 mL/min (ref >60.00)
Glucose, Bld: 135 mg/dL — ABNORMAL HIGH (ref 70–99)
Potassium: 3.5 mEq/L (ref 3.5–5.1)
SODIUM: 138 meq/L (ref 135–145)
TOTAL PROTEIN: 7.8 g/dL (ref 6.0–8.3)

## 2014-06-29 LAB — TSH: TSH: 0.8 u[IU]/mL (ref 0.35–4.50)

## 2014-06-29 LAB — URIC ACID: Uric Acid, Serum: 4.6 mg/dL (ref 2.4–7.0)

## 2014-06-29 LAB — SEDIMENTATION RATE: Sed Rate: 20 mm/hr (ref 0–22)

## 2014-06-29 MED ORDER — LOSARTAN POTASSIUM 50 MG PO TABS: 50.0000 mg | ORAL_TABLET | Freq: Every day | ORAL | Status: DC

## 2014-06-29 NOTE — Progress Notes (Signed)
Pre visit review using our clinic review tool, if applicable. No additional management support is needed unless otherwise documented below in the visit note. 

## 2014-06-29 NOTE — Assessment & Plan Note (Signed)
Patient has been making improvement which is wonderful. Encourage her to continue the exercises 3 times a week as well as the icing protocol. Patient no longer needs to do the braces has frequent. Patient will followup again in 4 weeks for further evaluation.

## 2014-06-29 NOTE — Patient Instructions (Signed)
Start losartan daily. really no side effects Labs downstairs today.  You are doing great.  Continue the exercises 3 times a week.  I will call you with the results Come back in 4 weeks.

## 2014-06-29 NOTE — Assessment & Plan Note (Signed)
Patient has made improvement on the allopurinol. We will get labs today to make sure that there is no side effects to the paternal and get more of a baseline of uric acid. In addition her that I would like to rule out any hyperparathyroid that could be giving very similar presentation. Encourage her to continue to watch her diet. Because she is also had her blood pressure being elevated at home she states that like to start her on a low dose olmesartan which will also help her with her uric acid. Patient then to followup with primary care provider for further evaluation and treatment of her blood pressure continues to stay high. I will see patient again in 4 weeks to make sure she continues to improve.

## 2014-06-29 NOTE — Progress Notes (Signed)
  [name] [place], [place] [address] Phone: [phone] Subjective:      CC: Arm pain followup. Crystal arthropathy  Connie Ho is a 53 y.o. female coming in with complaint of bilateral arm pain. Patient was diagnosed with a lateral epicondylitis as well as crystal arthropathy. Patient did have significant decrease in the crystal arthropathy previously. Patient was given home exercise program as well as bracing and we discussed icing regimen. Patient was placed on allopurinol and states that she has been taking it daily. Patient was given a topical anti-inflammatory to try. Patient states she is approximately 75% better. Patient states that some of her pain has been improving. Denies any side effects any medications at this time. Patient states that the lateral epicondylar region is also less tender than it was previously. Patient does have some dull aching pain in her muscles. Patient would like to be 100% but is happy that she's making improvement.     Past medical history, social, surgical and family history all reviewed in electronic medical record.   Review of Systems: No headache, visual changes, nausea, vomiting, diarrhea, constipation, dizziness, abdominal pain, skin rash, fevers, chills, night sweats, weight loss, swollen lymph nodes, body aches, joint swelling, muscle aches, chest pain, shortness of breath, mood changes.   Objective Blood pressure 132/80, pulse 76, temperature 98.4 F (36.9 C), temperature source Oral, weight 205 lb 8 oz (93.214 kg), SpO2 97.00%.  General: No apparent distress alert and oriented x3 mood and affect normal, dressed appropriately.  HEENT: Pupils equal, extraocular movements intact  Respiratory: Patient's speak in full sentences and does not appear short of breath  Cardiovascular: No lower extremity edema, non tender, no erythema  Skin: Warm dry intact with no signs of infection or rash  on extremities or on axial skeleton.  Abdomen: Soft nontender  Neuro: Cranial nerves II through XII are intact, neurovascularly intact in all extremities with 2+ DTRs and 2+ pulses.  Lymph: No lymphadenopathy of posterior or anterior cervical chain or axillae bilaterally.  Gait normal with good balance and coordination.  MSK:  Non tender with full range of motion and good stability and symmetric strength and tone of shoulders,  wrist, hip, knee and ankles bilaterally.  Elbow: right  Unremarkable to inspection. Range of motion full pronation, supination, flexion, extension. Strength is full to all of the above directions to the contralateral side and patient is significantly less tender than previous exam Stable to varus, valgus stress. Negative moving valgus stress test. Ulnar nerve does not sublux. Negative cubital tunnel Tinel's. Contralateral elbow unremarkable.    Impression and Recommendations:     This case required medical decision making of moderate complexity.

## 2014-07-02 LAB — PTH, INTACT AND CALCIUM
Calcium: 9.9 mg/dL (ref 8.4–10.5)
PTH: 49 pg/mL (ref 14.0–72.0)

## 2014-07-09 ENCOUNTER — Telehealth: Payer: Self-pay | Admitting: Cardiovascular Disease

## 2014-07-09 MED ORDER — DILTIAZEM HCL ER BEADS 120 MG PO CP24: 120.0000 mg | ORAL_CAPSULE | Freq: Two times a day (BID) | ORAL | Status: DC

## 2014-07-09 NOTE — Telephone Encounter (Signed)
Connie Ho needs refill on diltiazem (TIAZAC) 120 MG 24 hr capsule  Please call [facility].

## 2014-07-27 ENCOUNTER — Ambulatory Visit (INDEPENDENT_AMBULATORY_CARE_PROVIDER_SITE_OTHER): Payer: 59 | Admitting: Family Medicine

## 2014-07-27 ENCOUNTER — Encounter: Payer: Self-pay | Admitting: Family Medicine

## 2014-07-27 VITALS — BP 136/84 | HR 57 | Ht 66.0 in | Wt 205.0 lb

## 2014-07-27 DIAGNOSIS — M109 Gout, unspecified: Secondary | ICD-10-CM

## 2014-07-27 MED ORDER — GABAPENTIN 300 MG PO CAPS: ORAL_CAPSULE | ORAL | Status: DC

## 2014-07-27 MED ORDER — COLCHICINE 0.6 MG PO TABS: 0.6000 mg | ORAL_TABLET | Freq: Every day | ORAL | Status: DC

## 2014-07-27 NOTE — Patient Instructions (Signed)
Good to see you Colchicine 2 times daily for 5 days then daily.  Continue the allopurinol Gabapentin 300mg  at night Come back again in 2-3 weeks.

## 2014-07-27 NOTE — Assessment & Plan Note (Signed)
Patient continues to have what I think is a crystal arthropathy. Differential also includes hepatitis C viral which patient does have a past medical history of. Patient though does not seem to be extremely sick in liver enzymes are normal. Patient though does have an elevated hemoglobin that has been monitored in the past. Patient has had a full rheumatology workup previously for any autoimmune diseases as well. At this time we're going to add colchicine to patient's allopurinol and see if we can make any increase benefit. In addition to this we added gabapentin at night to help with the nighttime pain. Patient's will continue the other home remedies that we can having success with. Patient and will come back again 3 weeks. Continuing to have pain we may need to consider viral load with more labs. He may also need to consider referral to hematology.  Spent greater than 25 minutes with patient face-to-face and had greater than 50% of counseling including as described above in assessment and plan.

## 2014-07-27 NOTE — Progress Notes (Signed)
  [name] [place], [place] [address] Phone: [phone] Subjective:      CC: Arm pain followup. Crystal arthropathy  [id number] Connie Ho is a 53 y.o. female coming in with complaint of bilateral arm pain. Patient was diagnosed with a lateral epicondylitis as well as crystal arthropathy. Patient did have significant decrease in the crystal arthropathy previously. Patient was given home exercise program as well as bracing and we discussed icing regimen. Patient was placed on allopurinol and states that she has been taking it daily. Patient was given a topical anti-inflammatory to try. Patient was doing significantly better. Patient states the unfortunately now she is having more inflammation in different areas. Patient states that her feet were significantly painful and red for couple days and then it seemed to dissipate and then she had it in her hands as well. Patient's uric acid level is 4.6. He is taking the allopurinol religiously. States that she does not feel like she isn't as good as control she was previously.     Past medical history, social, surgical and family history all reviewed in electronic medical record.   Review of Systems: No headache, visual changes, nausea, vomiting, diarrhea, constipation, dizziness, abdominal pain, skin rash, fevers, chills, night sweats, weight loss, swollen lymph nodes, body aches, joint swelling, muscle aches, chest pain, shortness of breath, mood changes.   Objective Blood pressure 136/84, pulse 57, height 5\' 6"  (1.676 m), weight 205 lb (92.987 kg), SpO2 97.00%.  General: No apparent distress alert and oriented x3 mood and affect normal, dressed appropriately.  HEENT: Pupils equal, extraocular movements intact  Respiratory: Patient's speak in full sentences and does not appear short of breath  Cardiovascular: No lower extremity edema, non tender, no erythema  Skin: Warm dry intact with no  signs of infection or rash on extremities or on axial skeleton.  Abdomen: Soft nontender  Neuro: Cranial nerves II through XII are intact, neurovascularly intact in all extremities with 2+ DTRs and 2+ pulses.  Lymph: No lymphadenopathy of posterior or anterior cervical chain or axillae bilaterally.  Gait normal with good balance and coordination.  MSK:  Non tender with full range of motion and good stability and symmetric strength and tone of shoulders,  wrist, hip, knee and ankles bilaterally. Multiple aches and pains in multiple areas seem to be more joint related and not muscular related. Elbow: right  Unremarkable to inspection. Range of motion full pronation, supination, flexion, extension. Strength is full to all of the above directions to the contralateral side and patient is significantly less tender than previous exam Stable to varus, valgus stress. Negative moving valgus stress test. Ulnar nerve does not sublux. Negative cubital tunnel Tinel's. Contralateral elbow unremarkable.    Impression and Recommendations:     This case required medical decision making of moderate complexity.

## 2014-08-10 ENCOUNTER — Encounter: Payer: Self-pay | Admitting: Family Medicine

## 2014-08-10 ENCOUNTER — Ambulatory Visit (INDEPENDENT_AMBULATORY_CARE_PROVIDER_SITE_OTHER): Payer: 59 | Admitting: Family Medicine

## 2014-08-10 VITALS — BP 116/84 | HR 60 | Ht 66.0 in | Wt 204.0 lb

## 2014-08-10 DIAGNOSIS — IMO0001 Reserved for inherently not codable concepts without codable children: Secondary | ICD-10-CM

## 2014-08-10 MED ORDER — VENLAFAXINE HCL ER 37.5 MG PO CP24: 75.0000 mg | ORAL_CAPSULE | Freq: Every day | ORAL | Status: DC

## 2014-08-10 MED ORDER — HYDROXYZINE HCL 25 MG PO TABS: 25.0000 mg | ORAL_TABLET | Freq: Three times a day (TID) | ORAL | Status: DC | PRN

## 2014-08-10 NOTE — Assessment & Plan Note (Signed)
Do no that patient's underlying problem does have some crystal arthropathy which did respond quickly to the allopurinol previously. We will continue the allopurinol but stopped the colchicine because she is not making any difference. I do think that this is multifactorial in likely has some type of underlying conditions such as fibromyalgia. Differential continues to be concerning for autoimmune but has seen a rheumatologist previously for full workup. Patient did have a test that was positive for hepatitis C as well but workup including liver biopsy previously has been unremarkable. Patient is having no jaundice, no abdominal pain, no weight loss to make me think otherwise. We are going to treat this as more of a myalgia and possibly fibromyalgia. Patient will be started on Effexor. We are going to titrate slowly. Discussed the possibility is certain side effects. Patient will try this and come back and see me again in 3 weeks for further evaluation. Patient denies any suicidal or homicidal ideation.  Spent greater than 25 minutes with patient face-to-face and had greater than 50% of counseling including as described above in assessment and plan.

## 2014-08-10 NOTE — Patient Instructions (Addendum)
It is good to see you.  Stop the colchicine Start effexor and take 1 pill daily for 10 days then 2 pills daily thereafter.  Hydroxyzine when you need it.  Exercises for your back 3 times a week Important to stay active Continue the vitamins Come back again in 3 weeks.

## 2014-08-10 NOTE — Progress Notes (Signed)
  [facility] [place], [place] [address] Phone: [phone] Subjective:      CC:  Connie Ho  [id number] Connie Ho is a 53 y.o. female coming in with complaint of bilateral arm pain. Patient has been diagnosed with Connie Ho and has been tried allopurinol and did have colchicine added at last appointment. Patient states that she has not noticed any significant difference. Patient does notice that she is having more tenderness in multiple other joints. Patient states it is a dull aching sensation that does keep her from some activity. States that she is having some difficulty at night as well. Patient is concerned because she feels that distress with made previously are not improving and she could be potentially worsening.     Past medical history, social, surgical and family history all reviewed in electronic medical record.   Review of Systems: No headache, visual changes, nausea, vomiting, diarrhea, constipation, dizziness, abdominal pain, skin rash, fevers, chills, night sweats, weight loss, swollen lymph nodes, body aches, joint swelling, muscle aches, chest pain, shortness of breath, mood changes.   Objective Blood pressure 116/84, pulse 60, height 5\' 6"  (1.676 m), weight 204 lb (92.534 kg), SpO2 98.00%.  General: No apparent distress alert and oriented x3 mood and affect normal, dressed appropriately.  HEENT: Pupils equal, extraocular movements intact  Respiratory: Patient's speak in full sentences and does not appear short of breath  Cardiovascular: No lower extremity edema, non tender, no erythema  Skin: Warm dry intact with no signs of infection or rash on extremities or on axial skeleton.  Abdomen: Soft nontender  Neuro: Cranial nerves II through XII are intact, neurovascularly intact in all extremities with 2+ DTRs and 2+ pulses.  Lymph: No lymphadenopathy of posterior or anterior cervical chain or axillae  bilaterally.  Gait normal with good balance and coordination.  MSK:  Non tender with full range of motion and good stability and symmetric strength and tone of shoulders,  wrist, hip, knee and ankles bilaterally. Multiple aches and pains in multiple areas seem to be more joint related and not muscular related. Fibromyalgia exam shows the patient is positive with 18 of the 20 different sites..    Impression and Recommendations:     This case required medical decision making of moderate complexity.

## 2014-08-16 ENCOUNTER — Ambulatory Visit: Payer: 59 | Admitting: Cardiovascular Disease

## 2014-08-31 ENCOUNTER — Ambulatory Visit: Payer: 59 | Admitting: Family Medicine

## 2014-09-06 ENCOUNTER — Encounter: Payer: Self-pay | Admitting: Cardiovascular Disease

## 2014-09-06 ENCOUNTER — Ambulatory Visit (INDEPENDENT_AMBULATORY_CARE_PROVIDER_SITE_OTHER): Payer: 59 | Admitting: Cardiovascular Disease

## 2014-09-06 VITALS — BP 125/81 | HR 73 | Ht 66.0 in | Wt 206.0 lb

## 2014-09-06 DIAGNOSIS — I2510 Atherosclerotic heart disease of native coronary artery without angina pectoris: Secondary | ICD-10-CM

## 2014-09-06 DIAGNOSIS — M109 Gout, unspecified: Secondary | ICD-10-CM

## 2014-09-06 DIAGNOSIS — I471 Supraventricular tachycardia, unspecified: Secondary | ICD-10-CM

## 2014-09-06 DIAGNOSIS — I10 Essential (primary) hypertension: Secondary | ICD-10-CM

## 2014-09-06 DIAGNOSIS — M1009 Idiopathic gout, multiple sites: Secondary | ICD-10-CM

## 2014-09-06 NOTE — Progress Notes (Signed)
Patient ID: Connie Ho, female   DOB: 1961-08-03, 53 y.o.   MRN: [id number]      SUBJECTIVE: The patient is a 53 year old woman was here to followup for paroxysmal supraventricular tachycardia. She has a history of hypertension and nonobstructive coronary artery disease. She also has a history of asthma and has been treated for PSVT with calcium channel blockers for this reason. She has been diagnosed with gout and is on allopurinol. Uric acid, calcium, TSH all normal, as well as ESR.   Review of Systems: As per "subjective", otherwise negative.  Allergies  Allergen Reactions  . Peanut-Containing Drug Products   . Aspirin Hives  . Ibuprofen Hives    Current Outpatient Prescriptions  Medication Sig Dispense Refill  . albuterol (PROVENTIL) (2.5 MG/3ML) 0.083% nebulizer solution Take 2.5 mg by nebulization every 6 (six) hours as needed for wheezing.      Marland Kitchen allopurinol (ZYLOPRIM) 300 MG tablet Take 1 tablet (300 mg total) by mouth daily.  90 tablet  1  . colchicine 0.6 MG tablet Take 1 tablet (0.6 mg total) by mouth daily.  90 tablet  1  . Diclofenac Sodium (PENNSAID) 2 % SOLN Place 2 application onto the skin 2 (two) times daily.  112 g  3  . diltiazem (TIAZAC) 120 MG 24 hr capsule Take 1 capsule (120 mg total) by mouth 2 (two) times daily.  60 capsule  1  . ESTRACE VAGINAL 0.1 MG/GM vaginal cream       . gabapentin (NEURONTIN) 300 MG capsule nightly  90 capsule  3  . hydrOXYzine (ATARAX/VISTARIL) 25 MG tablet Take 1 tablet (25 mg total) by mouth every 8 (eight) hours as needed for anxiety.  30 tablet  0  . losartan (COZAAR) 50 MG tablet Take 1 tablet (50 mg total) by mouth daily.  30 tablet  3  . pantoprazole (PROTONIX) 40 MG tablet Take 40 mg by mouth 2 (two) times daily.       . traMADol (ULTRAM) 50 MG tablet Take 1 tablet (50 mg total) by mouth at bedtime as needed.  30 tablet  0  . venlafaxine XR (EFFEXOR XR) 37.5 MG 24 hr capsule Take 2 capsules (75 mg total) by mouth daily  with breakfast.  60 capsule  1  . VENTOLIN HFA 108 (90 BASE) MCG/ACT inhaler Inhale 2 puffs into the lungs every 6 (six) hours as needed.        No current facility-administered medications for this visit.    Past Medical History  Diagnosis Date  . Chest pain, unspecified 2008    Cardiac Catheterization   . Palpitations   . Pure hypercholesterolemia   . Unspecified asthma(493.90)   . Postinflammatory pulmonary fibrosis   . Cardiomegaly   . Polycythemia vera(238.4)   . Chronic hepatitis C without mention of hepatic coma   . Obesity, unspecified   . Other and unspecified hyperlipidemia   . Unspecified asthma(493.90)   . Degenerative disk disease   . Other premature beats   . First degree atrioventricular block   . Unspecified essential hypertension   . PSVT (paroxysmal supraventricular tachycardia)   . CAD (coronary artery disease)     Nonobstructive, 09/2011 (false positive GXT)  . HTN (hypertension)     Past Surgical History  Procedure Laterality Date  . Cholecystectomy      post  . Abdominal hysterectomy      Bilateral salpingo-oophorectomy  . Sinus surgery with instatrak  1995  . Cardiac catheterization  2008  mild CAD. 50% diagonal stenosis.     History   Social History  . Marital Status: Married    Spouse Name: STEVEN     Number of Children: 4  . Years of Education: N/A   Occupational History  . SELF EMPLOYED    Social History Main Topics  . Smoking status: Never Smoker   . Smokeless tobacco: Never Used  . Alcohol Use: No  . Drug Use: No  . Sexual Activity: Yes    Partners: Male   Other Topics Concern  . Not on file   Social History Narrative   She lives with husband, daughter, mother, and mother-in-law.   She does alterations (own business).   Highest level of education:  High school           Filed Vitals:   09/06/14 1145  BP: 125/81  Pulse: 73  Height: 5' 6"  (1.676 m)  Weight: 206 lb (93.441 kg)  SpO2: 98%    PHYSICAL  EXAM General: NAD HEENT: Normal. Neck: No JVD, no thyromegaly. Lungs: Clear to auscultation bilaterally with normal respiratory effort. CV: Nondisplaced PMI.  Regular rate and rhythm, normal S1/S2, no S3/S4, no murmur. No pretibial or periankle edema.  No carotid bruit.  Normal pedal pulses.  Abdomen: Soft, nontender, no hepatosplenomegaly, no distention.  Neurologic: Alert and oriented x 3.  Psych: Normal affect. Skin: Normal. Musculoskeletal: Normal range of motion, tophi in elbows b/l. Extremities: No clubbing or cyanosis.   ECG: Most recent ECG reviewed.      ASSESSMENT AND PLAN:  PSVT (paroxysmal supraventricular tachycardia)  Symptomatically stable. Continue long-acting diltiazem 120 twice a day. Will continue to treat PSVT with a calcium channel blocker, given her underlying asthma.   CAD (coronary artery disease)  Nonobstructive in nature. No further workup indicated. Patient reports no CP, and had a normal echocardiogram with only significant disease in a small diagonal branch in 09/2011.   Essential hypertension  Controlled with diltiazem and losartan. Pain from gout likely contributing.  Gout Currently on allopurinol. All labs reviewed. Colchicine did not appear to help.  Dispo: f/u 1 year.  Kate Sable, M.D., F.A.C.C.

## 2014-09-06 NOTE — Patient Instructions (Signed)
Continue all current medications. Your physician wants you to follow up in:  1 year.  You will receive a reminder letter in the mail one-two months in advance.  If you don't receive a letter, please call our office to schedule the follow up appointment   

## 2014-09-07 ENCOUNTER — Telehealth: Payer: Self-pay | Admitting: *Deleted

## 2014-09-07 ENCOUNTER — Encounter: Payer: Self-pay | Admitting: Family Medicine

## 2014-09-07 ENCOUNTER — Other Ambulatory Visit (INDEPENDENT_AMBULATORY_CARE_PROVIDER_SITE_OTHER): Payer: 59

## 2014-09-07 ENCOUNTER — Ambulatory Visit (INDEPENDENT_AMBULATORY_CARE_PROVIDER_SITE_OTHER): Payer: 59 | Admitting: Family Medicine

## 2014-09-07 VITALS — BP 140/84 | HR 61 | Ht 66.0 in | Wt 208.0 lb

## 2014-09-07 DIAGNOSIS — M109 Gout, unspecified: Secondary | ICD-10-CM

## 2014-09-07 DIAGNOSIS — M6530 Trigger finger, unspecified finger: Secondary | ICD-10-CM

## 2014-09-07 DIAGNOSIS — IMO0001 Reserved for inherently not codable concepts without codable children: Secondary | ICD-10-CM

## 2014-09-07 MED ORDER — FEBUXOSTAT 80 MG PO TABS: 1.0000 | ORAL_TABLET | Freq: Every day | ORAL | Status: DC

## 2014-09-07 MED ORDER — VENLAFAXINE HCL 75 MG PO TABS: 150.0000 mg | ORAL_TABLET | Freq: Two times a day (BID) | ORAL | Status: DC

## 2014-09-07 NOTE — Assessment & Plan Note (Signed)
I do believe that patient's myalgia and myositis could also be contributed to a chronic pain like syndrome. Do think that he Effexor could be beneficial but we need to titrate up. Encourage patient to do this every 2 weeks. Patient was given instructions on the safely. Patient will call if she has any type of side effects. I do believe the patient will do well overall. Patient will come back again in 4 weeks for further evaluation and treatment. The goal to get patient to 150 mg twice a day.

## 2014-09-07 NOTE — Assessment & Plan Note (Signed)
Patient initially was making some improvement. I didn't and patient has plateaued at this time. I do not want increase patient's allopurinol more than a 300 mg at this time and instead we will try transitioning to uloric with recent evidence showing that this can make some improvement. Patient was started on this and we will monitor for side effects. Patient has a history of having a positive hepatitis C but biopsy was negative. I do feel that at followup we should get liver enzymes for further evaluation. Patient notes she has any side effects she will call me immediately.

## 2014-09-07 NOTE — Patient Instructions (Signed)
Very good to see you Ice when you need it.  Increase effexor to 2 pills twice daily for 2 weeks  Then 3 pills 2 times a day for 2 weeks.  Stop the allopurinol for now and start uloric daily.  Wear brace day and night for next 2 days then nightly for 2 weeks.  See me again in 4 weeks and we will check liver panel with you on uloric.

## 2014-09-07 NOTE — Assessment & Plan Note (Signed)
The patient was given an injection today. Patient will be wearing a extension brace for the next 48 hours and then will wear nightly for 2 weeks. We discussed what her finger is in multiple bleeding significant repetitive activity. Patient will work on range of motion. We'll do icing. Patient will come back and see me again in 4 weeks for further evaluation.

## 2014-09-07 NOTE — Telephone Encounter (Signed)
Received call from Ashley/pharmacist stating wanting to verify direction on e-script that was sent in for effexor. Gave her md instruction for effexor...Johny Chess

## 2014-09-07 NOTE — Progress Notes (Signed)
  [name] [address], [place] [address] Phone: [phone] Subjective:      CC:  Crystal arthropathy, fobromyalgia  [id number] Connie Ho is a 53 y.o. female coming in with complaint of bilateral arm pain. 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  Patient's was not having any improvement with colchicine so we discontinued this previously. Patient though also had chronic pain like syndrome or possibly fibromyalgia-type pain and was given a flexor to try. Patient was carried to titrate up and continued with one pill twice a day at 37.5 some patient has not noticed any significant improvement. She states that she would be able to live with the pain that she has now but is concerned overall. Patient would like to be pain free of course.     Past medical history, social, surgical and family history all reviewed in electronic medical record.   Review of Systems: No headache, visual changes, nausea, vomiting, diarrhea, constipation, dizziness, abdominal pain, skin rash, fevers, chills, night sweats, weight loss, swollen lymph nodes, body aches, joint swelling, muscle aches, chest pain, shortness of breath, mood changes.   Objective Blood pressure 140/84, pulse 61, height 5\' 6"  (1.676 m), weight 208 lb (94.348 kg), SpO2 94.00%.  General: No apparent distress alert and oriented x3 mood and affect normal, dressed appropriately.  HEENT: Pupils equal, extraocular movements intact  Respiratory: Patient's speak in full sentences and does not appear short of breath  Cardiovascular: No lower extremity edema, non tender, no erythema  Skin: Warm dry intact with no signs of infection or rash on extremities or on axial skeleton.  Abdomen: Soft nontender  Neuro: Cranial nerves II through XII are intact, neurovascularly intact in all extremities with 2+ DTRs and 2+ pulses.  Lymph: No lymphadenopathy of posterior or  anterior cervical chain or axillae bilaterally.  Gait normal with good balance and coordination.  MSK:  Non tender with full range of motion and good stability and symmetric strength and tone of shoulders,  wrist, hip, knee and ankles bilaterally. Multiple aches and pains in multiple areas seem to be more joint related and not muscular related. Fibromyalgia exam shows the patient is positive with 18 of the 20 different sites.. End exam shows the patient is having triggering of the right ring finger. This is tender to palpation.  Limited  skeletal ultrasound was performed and interpreted by Hulan Saas, M   limited musculoskeletal ultrasound shows the patient does have an nodule in tendon sheath effusion of the A2 pulley. Impression: Trigger finger   procedure note After verbal consent patient was prepped with alcohol swabs and with a 25-gauge 1 inch needle was injected with 0.5 cc of 0.5% Marcaine and 0.5 cc of Kenalog 40 mg/dL under ultrasound guidance into the tendon sheath. Patient tolerated the procedure very well. No blood loss postinjection instructions given.    Impression and Recommendations:     This case required medical decision making of moderate complexity.

## 2014-10-05 ENCOUNTER — Ambulatory Visit: Payer: 59 | Admitting: Family Medicine

## 2014-10-10 ENCOUNTER — Other Ambulatory Visit: Payer: Self-pay | Admitting: *Deleted

## 2014-10-10 MED ORDER — DILTIAZEM HCL ER BEADS 120 MG PO CP24: 120.0000 mg | ORAL_CAPSULE | Freq: Two times a day (BID) | ORAL | Status: DC

## 2014-10-12 ENCOUNTER — Other Ambulatory Visit (INDEPENDENT_AMBULATORY_CARE_PROVIDER_SITE_OTHER): Payer: 59

## 2014-10-12 ENCOUNTER — Encounter: Payer: Self-pay | Admitting: Family Medicine

## 2014-10-12 ENCOUNTER — Ambulatory Visit (INDEPENDENT_AMBULATORY_CARE_PROVIDER_SITE_OTHER): Payer: 59 | Admitting: Family Medicine

## 2014-10-12 VITALS — BP 124/78 | HR 84 | Wt 203.0 lb

## 2014-10-12 DIAGNOSIS — M25551 Pain in right hip: Secondary | ICD-10-CM

## 2014-10-12 DIAGNOSIS — R945 Abnormal results of liver function studies: Principal | ICD-10-CM

## 2014-10-12 DIAGNOSIS — R7989 Other specified abnormal findings of blood chemistry: Secondary | ICD-10-CM

## 2014-10-12 DIAGNOSIS — M6530 Trigger finger, unspecified finger: Secondary | ICD-10-CM

## 2014-10-12 DIAGNOSIS — M1009 Idiopathic gout, multiple sites: Secondary | ICD-10-CM

## 2014-10-12 DIAGNOSIS — M7061 Trochanteric bursitis, right hip: Secondary | ICD-10-CM | POA: Insufficient documentation

## 2014-10-12 DIAGNOSIS — M109 Gout, unspecified: Secondary | ICD-10-CM

## 2014-10-12 LAB — HEPATIC FUNCTION PANEL
ALT: 34 U/L (ref 0–35)
AST: 32 U/L (ref 0–37)
Albumin: 3.7 g/dL (ref 3.5–5.2)
Alkaline Phosphatase: 95 U/L (ref 39–117)
Bilirubin, Direct: 0.1 mg/dL (ref 0.0–0.3)
Total Bilirubin: 0.9 mg/dL (ref 0.2–1.2)
Total Protein: 7.9 g/dL (ref 6.0–8.3)

## 2014-10-12 MED ORDER — PANTOPRAZOLE SODIUM 40 MG PO TBEC: 40.0000 mg | DELAYED_RELEASE_TABLET | Freq: Two times a day (BID) | ORAL | Status: DC

## 2014-10-12 MED ORDER — VENLAFAXINE HCL 75 MG PO TABS: 225.0000 mg | ORAL_TABLET | Freq: Two times a day (BID) | ORAL | Status: DC

## 2014-10-12 NOTE — Patient Instructions (Addendum)
Good to see you We will get labs today.  Ice to hip in 6 hours.  New exercises for hip 3 times a week if possible.  I am very happy with the medications. We will make no changes at this time.  Lets space you out to 6 weeks.

## 2014-10-12 NOTE — Assessment & Plan Note (Signed)
Resolved after injection 

## 2014-10-12 NOTE — Assessment & Plan Note (Signed)
Discussed the importance of hip abductor strengthening given home exercises. We discussed icing protocol. Discussed which different stretches will be beneficial. Discuss how tight hip flexors can play a role. Discuss proper shoe wear. Patient will follow-up again in 6 weeks for further evaluation.

## 2014-10-12 NOTE — Progress Notes (Signed)
[name] [address] [place], [place] [address] Phone: [phone] Subjective:      CC:  Connie Ho  FFM:[id number] Connie Ho is a 53 y.o. female coming in with complaint of bilateral arm pain. Patient has been diagnosed with Connie arthropathy and has been tried allopurinol.  Patient has been on different medications and we did make the change to Uloric at last follow up.  In addition to this with patients fibromyalgia patient was titrating up onto the Effexor. Patient was taken 225 mg twice daily. Patient has noticed that she's had some mild increase in energy and also has lost 5 pounds. Patient denies any side effects to the medication.   Patient at last visit also had a trigger finger that was injected. Patient states she is now able to get on rings which she has not been able to fit on this finger for greater than 5 years. Patient is having no pain and no triggering at this time.  Patient does have a new problem today. Patient is having right-sided hip pain. Lateral aspect. No groin pain. Worse when she lays on that side. Because it as a dull aching sensation that can be sharp with certain movements. Denies any giving out on her or any injury. Patient rates the severity of 4 out of 10. It is waking her up at night though.    Past medical history, social, surgical and family history all reviewed in electronic medical record.   Review of Systems: No headache, visual changes, nausea, vomiting, diarrhea, constipation, dizziness, abdominal pain, skin rash, fevers, chills, night sweats, weight loss, swollen lymph nodes, body aches, joint swelling, muscle aches, chest pain, shortness of breath, mood changes.   Objective Blood pressure 124/78, pulse 84, weight 203 lb (92.08 kg), SpO2 94.00%.  General: No apparent distress alert and oriented x3 mood and affect normal, dressed appropriately.  HEENT: Pupils equal, extraocular  movements intact  Respiratory: Patient's speak in full sentences and does not appear short of breath  Cardiovascular: No lower extremity edema, non tender, no erythema  Skin: Warm dry intact with no signs of infection or rash on extremities or on axial skeleton.  Abdomen: Soft nontender  Neuro: Cranial nerves II through XII are intact, neurovascularly intact in all extremities with 2+ DTRs and 2+ pulses.  Lymph: No lymphadenopathy of posterior or anterior cervical chain or axillae bilaterally.  Gait normal with good balance and coordination.  MSK:  Non tender with full range of motion and good stability and symmetric strength and tone of shoulders,  wrist,  knee and ankles bilaterally. Multiple aches and pains in multiple areas seem to be more joint related and not muscular related. MSK US performed of: Right This study was ordered, performed, and interpreted by Connie Ho D.O.  Hip: Trochanteric bursa with significant hypoechoic changes and swelling Acetabular labrum visualized and without tears, displacement, or effusion in joint. Femoral neck appears unremarkable without increased power doppler signal along Cortex.  IMPRESSION:  Greater trochanter bursitis   Procedure: Real-time Ultrasound Guided Injection of right greater trochanteric bursitis secondary to patient's body habitus Device: GE Logiq E  Ultrasound guided injection is preferred based studies that show increased duration, increased effect, greater accuracy, decreased procedural pain, increased response rate, and decreased cost with ultrasound guided versus blind injection.  Verbal informed consent obtained.  Time-out conducted.  Noted no overlying erythema, induration, or other signs of local infection.  Skin prepped in a sterile fashion.  Local anesthesia: Topical Ethyl chloride.  With sterile technique and under real time ultrasound guidance:  Greater trochanteric area was visualized and patient's bursa was noted. A  22-gauge 3 inch needle was inserted and 4 cc of 0.5% Marcaine and 1 cc of Kenalog 40 mg/dL was injected. Pictures taken Completed without difficulty  Pain immediately resolved suggesting accurate placement of the medication.  Advised to call if fevers/chills, erythema, induration, drainage, or persistent bleeding.  Images permanently stored and available for review in the ultrasound unit.  Impression: Technically successful ultrasound guided injection.       Impression and Recommendations:     This case required medical decision making of moderate complexity.

## 2014-10-12 NOTE — Assessment & Plan Note (Signed)
Patient is doing significantly better on the large. I would like to continue the same dose that we are at. We will discuss titrating down at next office visit in 6 weeks. We will check liver enzymes today. His lungs patient continues to do well we will see her in 6 weeks.

## 2014-11-12 ENCOUNTER — Telehealth: Payer: Self-pay | Admitting: Internal Medicine

## 2014-11-12 MED ORDER — LOSARTAN POTASSIUM 50 MG PO TABS: 50.0000 mg | ORAL_TABLET | Freq: Every day | ORAL | Status: DC

## 2014-11-12 MED ORDER — FEBUXOSTAT 80 MG PO TABS: 1.0000 | ORAL_TABLET | Freq: Every day | ORAL | Status: DC

## 2014-11-12 NOTE — Telephone Encounter (Signed)
Spoke to pt, rx sent to pharmacy.

## 2014-11-12 NOTE — Telephone Encounter (Signed)
Pt states pharmacy has sent 2 requests for refills, her meds for Gout and BP medicine.

## 2014-11-15 ENCOUNTER — Telehealth: Payer: Self-pay | Admitting: Family Medicine

## 2014-11-15 NOTE — Telephone Encounter (Signed)
Per Dr. Tamala Julian, he stated that it was not a side effect of those 2 medicines & that the pt should go either to the ER or call her PCP. Called & discussed with pt, she understood & stated she would call her PCP.

## 2014-11-15 NOTE — Telephone Encounter (Signed)
Pt called in and is have a few side effects of meds that Dr Tamala Julian have her and has as few question.   Best number [phone]

## 2014-11-15 NOTE — Telephone Encounter (Signed)
Spoke to pt, she states that on Tuesday she took her Effexor & also took 1 dose of the OTC Cold & Flu. Tuesday evening she began having numbness in both legs & was unable to walk. After 15-20 minutes it did start to get a little better. On Wednesday she didn't take any of the Cold & Flu, that evening she started having the numbness in both legs, this time the right leg was worse then the left & she was having to drag the leg. She also said she was having the same numbness in her right arm. Pt is concerned if this is a side effect of taking both medicines together or if this is something else.

## 2014-11-23 ENCOUNTER — Encounter: Payer: Self-pay | Admitting: Family Medicine

## 2014-11-23 ENCOUNTER — Ambulatory Visit (INDEPENDENT_AMBULATORY_CARE_PROVIDER_SITE_OTHER): Payer: 59 | Admitting: Family Medicine

## 2014-11-23 VITALS — BP 132/86 | HR 82 | Ht 66.0 in | Wt 204.0 lb

## 2014-11-23 DIAGNOSIS — G894 Chronic pain syndrome: Secondary | ICD-10-CM

## 2014-11-23 DIAGNOSIS — R319 Hematuria, unspecified: Secondary | ICD-10-CM | POA: Insufficient documentation

## 2014-11-23 DIAGNOSIS — M109 Gout, unspecified: Secondary | ICD-10-CM

## 2014-11-23 DIAGNOSIS — M1009 Idiopathic gout, multiple sites: Secondary | ICD-10-CM

## 2014-11-23 NOTE — Assessment & Plan Note (Signed)
Patient is responding well to the Effexor. There is a possibility the patient would need a low dose of Synthroid but then I would want to decrease on the Effexor. Patient did not want to make any changes at this time. Overall patient has made significant improvement. We will continue to monitor.

## 2014-11-23 NOTE — Assessment & Plan Note (Signed)
Patient is doing well at the high-dose Uloric.  Patient will continue on this medication at this time. We can consider the possibility of adding a low dose of colchicine if needed. We will discuss at follow-up.

## 2014-11-23 NOTE — Patient Instructions (Signed)
Good to see you We will send you to urology.  Try saffron 300mg  daily.  Keep doing the other medicines.  If knee acts up come back in  See me again in 6 weeks.

## 2014-11-23 NOTE — Progress Notes (Signed)
  [name] [place] [place], [place] [address] Phone: [phone] Subjective:      CC:  Crystal arthropathy, fobromyalgia  LEX:[id number] Connie Ho is a 53 y.o. female coming in with complaint of bilateral arm pain. Patient has been diagnosed with crystal arthropathy and is on uloric and effexor. Patient is doing well on these medicines. Patient states that she is now able to move her arms and be much more active. Patient is having some mild knee pain off and on and still has flares but overall is feeling better. Patient does not have any specific pains at this time.  Patient does bring up the possibility of hematuria. He is having that on a more regular basis. 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 Patient was seen a urologist in [place] and does not have a good relationship with this disposition and would like a second opinion. Was seeing Dr. Exie Parody.        Past medical history, social, surgical and family history all reviewed in electronic medical record.   Review of Systems: No headache, visual changes, nausea, vomiting, diarrhea, constipation, dizziness, abdominal pain, skin rash, fevers, chills, night sweats, weight loss, swollen lymph nodes, body aches, joint swelling, muscle aches, chest pain, shortness of breath, mood changes.   Objective Blood pressure 132/86, pulse 82, height 5\' 6"  (1.676 m), weight 204 lb (92.534 kg), SpO2 98 %.  General: No apparent distress alert and oriented x3 mood and affect normal, dressed appropriately.  HEENT: Pupils equal, extraocular movements intact  Respiratory: Patient's speak in full sentences and does not appear short of breath  Cardiovascular: No lower extremity edema, non tender, no erythema  Skin: Warm dry intact with no signs of infection or rash on extremities or on axial skeleton.  Abdomen: Soft nontender  Neuro: Cranial  nerves II through XII are intact, neurovascularly intact in all extremities with 2+ DTRs and 2+ pulses.  Lymph: No lymphadenopathy of posterior or anterior cervical chain or axillae bilaterally.  Gait normal with good balance and coordination.  MSK:  Non tender with full range of motion and good stability and symmetric strength and tone of shoulders,  wrist,  knee and ankles bilaterally. Multiple aches and pains in multiple areas but continues to be less than previous exam.       Impression and Recommendations:     This case required medical decision making of moderate complexity.

## 2014-11-23 NOTE — Assessment & Plan Note (Signed)
Discussed with patient at this time. Patient states that it seems to be increasing. Patient has not taken any anti-inflammatories and we discussed different foods that could be contributing. Patient has had a shallow ulcer before in her bladder she states that did need intervention. Patient does not want to go back to her previous physician. Discussed with her that I would put the referral in that she would likely need to get the notes from the other facility. Patient is agreeable.

## 2014-12-05 ENCOUNTER — Encounter: Payer: Self-pay | Admitting: Family Medicine

## 2014-12-05 ENCOUNTER — Ambulatory Visit (INDEPENDENT_AMBULATORY_CARE_PROVIDER_SITE_OTHER): Payer: 59 | Admitting: Family Medicine

## 2014-12-05 VITALS — BP 140/84 | HR 83 | Ht 66.0 in | Wt 208.0 lb

## 2014-12-05 DIAGNOSIS — S52502A Unspecified fracture of the lower end of left radius, initial encounter for closed fracture: Secondary | ICD-10-CM | POA: Insufficient documentation

## 2014-12-05 NOTE — Assessment & Plan Note (Signed)
Patient putting on brace today. We discussed icing protocol as well as trying to not remove the multiple cast. We discussed vitamin D supplementation with patient will continue. We discussed icing regimen. Patient and will come back and see me again in 3 weeks. At that time patient will get an x-ray beforehand to make sure that there is good healing. We did not repeat x-rays because it would not of change management today.  Spent greater than 25 minutes with patient face-to-face and had greater than 50% of counseling including as described above in assessment and plan.

## 2014-12-05 NOTE — Patient Instructions (Addendum)
Good to see you ICe if you need it.  Tylenol probably for pain.  See me again in 2 weeks, come early and get xray downstairs.  Likely in brace 3-4 more weeks.  Radial Fracture You have a broken bone (fracture) of the forearm. This is the part of your arm between the elbow and your wrist. Your forearm is made up of two bones. These are the radius and ulna. Your fracture is in the radial shaft. This is the bone in your forearm located on the thumb side. A cast or splint is used to protect and keep your injured bone from moving. The cast or splint will be on generally for about 5 to 6 weeks, with individual variations. HOME CARE INSTRUCTIONS   Keep the injured part elevated while sitting or lying down. Keep the injury above the level of your heart (the center of the chest). This will decrease swelling and pain.  Apply ice to the injury for 15-20 minutes, 03-04 times per day while awake, for 2 days. Put the ice in a plastic bag and place a towel between the bag of ice and your cast or splint.  Move your fingers to avoid stiffness and minimize swelling.  If you have a plaster or fiberglass cast:  Do not try to scratch the skin under the cast using sharp or pointed objects.  Check the skin around the cast every day. You may put lotion on any red or sore areas.  Keep your cast dry and clean.  If you have a plaster splint:  Wear the splint as directed.  You may loosen the elastic around the splint if your fingers become numb, tingle, or turn cold or blue.  Do not put pressure on any part of your cast or splint. It may break. Rest your cast only on a pillow for the first 24 hours until it is fully hardened.  Your cast or splint can be protected during bathing with a plastic bag. Do not lower the cast or splint into water.  Only take over-the-counter or prescription medicines for pain, discomfort, or fever as directed by your caregiver. SEEK IMMEDIATE MEDICAL CARE IF:   Your cast gets  damaged or breaks.  You have more severe pain or swelling than you did before getting the cast.  You have severe pain when stretching your fingers.  There is a bad smell, new stains and/or pus-like (purulent) drainage coming from under the cast.  Your fingers or hand turn pale or blue and become cold or your loose feeling. Document Released: 05/13/2006 Document Revised: 02/22/2012 Document Reviewed: 08/09/2006 [facility] Patient Information 2015 [place], [place]. This information is not intended to replace advice given to you by your health care provider. Make sure you discuss any questions you have with your health care provider.

## 2014-12-05 NOTE — Progress Notes (Signed)
  [name] [address] [place], [place] [address] Phone: [phone] Subjective:       CC: Left wrist pain after fall  [id number] Connie Ho is a 53 y.o. female coming in with complaint of left wrist pain after fall. Patient did fall on December 12 and was seen in emergency room. Patient did bring x-rays. Reviewing patient's x-rays patient did have a nondisplaced fracture of the distal radius. Patient was put in a splint and is following up today. Patient states that it is still significant a sore. Patient's splint did go above her elbow and states that she has been in significant pain so she took the splint off 2 days ago. Patient has been putting on and off and trying to move her elbow somewhat because of the pain. Patient states this seems to be doing alright but if she moves certain ways she has severe pain. Patient has not been using the arm at all. Denies any new symptoms such as numbness.     Past medical history, social, surgical and family history all reviewed in electronic medical record.   Review of Systems: No headache, visual changes, nausea, vomiting, diarrhea, constipation, dizziness, abdominal pain, skin rash, fevers, chills, night sweats, weight loss, swollen lymph nodes, body aches, joint swelling, muscle aches, chest pain, shortness of breath, mood changes.   Objective Blood pressure 140/84, pulse 83, height 5\' 6"  (1.676 m), weight 208 lb (94.348 kg), SpO2 97 %.  General: No apparent distress alert and oriented x3 mood and affect normal, dressed appropriately.  HEENT: Pupils equal, extraocular movements intact  Respiratory: Patient's speak in full sentences and does not appear short of breath  Cardiovascular: No lower extremity edema, non tender, no erythema  Skin: Warm dry intact with no signs of infection or rash on extremities or on axial skeleton.  Abdomen: Soft nontender  Neuro: Cranial nerves II through XII are intact,  neurovascularly intact in all extremities with 2+ DTRs and 2+ pulses.  Lymph: No lymphadenopathy of posterior or anterior cervical chain or axillae bilaterally.  Gait normal with good balance and coordination.  MSK:  Non tender with full range of motion and good stability and symmetric strength and tone of shoulders, elbows,  hip, knee and ankles bilaterally.  Wrist: Left Inspection normal with no visible erythema or swelling. Range of motion shows the patient is tender to palpation. Much in all range. Severely tender to palpation over the distal radius very mild tenderness over the snuff box. No tenderness over Canal of [place]. Unable to test strength secondary to pain      Impression and Recommendations:     This case required medical decision making of moderate complexity.

## 2014-12-24 ENCOUNTER — Ambulatory Visit: Payer: 59 | Admitting: Family Medicine

## 2014-12-27 ENCOUNTER — Ambulatory Visit (INDEPENDENT_AMBULATORY_CARE_PROVIDER_SITE_OTHER)
Admission: RE | Admit: 2014-12-27 | Discharge: 2014-12-27 | Disposition: A | Discharge status: Home / Self Care | Payer: 59 | Source: Ambulatory Visit | Attending: Family Medicine | Admitting: Family Medicine

## 2014-12-27 ENCOUNTER — Encounter: Payer: Self-pay | Admitting: Family Medicine

## 2014-12-27 ENCOUNTER — Ambulatory Visit (INDEPENDENT_AMBULATORY_CARE_PROVIDER_SITE_OTHER): Payer: 59 | Admitting: Family Medicine

## 2014-12-27 VITALS — BP 130/76 | HR 80 | Ht 66.0 in | Wt 209.0 lb

## 2014-12-27 DIAGNOSIS — S52502A Unspecified fracture of the lower end of left radius, initial encounter for closed fracture: Secondary | ICD-10-CM

## 2014-12-27 NOTE — Patient Instructions (Signed)
Good to see you Ice is your friend Continue the brace daily for 1 more week but come out and do ABC's 2 times daily.  After that then no brace at home.  Drop effexor to 2 pills 2 times a day.  Watch that blood pressure. Write it down daily and bring it out.  See you next week.

## 2014-12-27 NOTE — Progress Notes (Signed)
  [name] [place],  [address] Phone: [phone] Subjective:       CC: Left wrist pain after fall  Connie Ho is a 54 y.o. female coming in with complaint of left wrist pain after fall. Patient did fall on December 12 and was seen in emergency room. Patient did bring x-rays. Reviewing patient's x-rays patient did have a nondisplaced fracture of the distal radius. Patient's was seen by me and I'll dictate patient in a race. Patient is here for further follow-up. Patient states that the pain is improved. Patient states that overall no new symptoms.  New x-rays were taken today. X-rays show the patient does have good healing within the distal radius fracture with no displacement.     Past medical history, social, surgical and family history all reviewed in electronic medical record.   Review of Systems: No headache, visual changes, nausea, vomiting, diarrhea, constipation, dizziness, abdominal pain, skin rash, fevers, chills, night sweats, weight loss, swollen lymph nodes, body aches, joint swelling, muscle aches, chest pain, shortness of breath, mood changes.   Objective Blood pressure 130/76, pulse 80, height 5\' 6"  (1.676 m), weight 209 lb (94.802 kg), SpO2 97 %.  General: No apparent distress alert and oriented x3 mood and affect normal, dressed appropriately.  HEENT: Pupils equal, extraocular movements intact  Respiratory: Patient's speak in full sentences and does not appear short of breath  Cardiovascular: No lower extremity edema, non tender, no erythema  Skin: Warm dry intact with no signs of infection or rash on extremities or on axial skeleton.  Abdomen: Soft nontender  Neuro: Cranial nerves II through XII are intact, neurovascularly intact in all extremities with 2+ DTRs and 2+ pulses.  Lymph: No lymphadenopathy of posterior or anterior cervical chain or axillae bilaterally.  Gait normal with good balance  and coordination.  MSK:  Non tender with full range of motion and good stability and symmetric strength and tone of shoulders, elbows,  hip, knee and ankles bilaterally.  Wrist: Left Inspection normal with no visible erythema or swelling. Range of motion limited with only 5 of extension and 10 of flexion. Still tender over the distal radius No tenderness over Canal of [place].       Impression and Recommendations:     This case required medical decision making of moderate complexity.

## 2014-12-27 NOTE — Assessment & Plan Note (Signed)
Patient has made some improvement and we do see good callus formation over the distal radius. We discussed icing regimen and home exercises to start increasing the range of motion but to continue to wear the brace outside. Patient will then come back and see me again in 2-3 weeks to make sure completely healed.

## 2014-12-27 NOTE — Progress Notes (Signed)
Pre visit review using our clinic review tool, if applicable. No additional management support is needed unless otherwise documented below in the visit note. 

## 2015-01-04 ENCOUNTER — Ambulatory Visit: Payer: 59 | Admitting: Family Medicine

## 2015-01-11 ENCOUNTER — Encounter: Payer: Self-pay | Admitting: Family Medicine

## 2015-01-11 ENCOUNTER — Ambulatory Visit (INDEPENDENT_AMBULATORY_CARE_PROVIDER_SITE_OTHER): Payer: 59 | Admitting: Family Medicine

## 2015-01-11 ENCOUNTER — Other Ambulatory Visit (INDEPENDENT_AMBULATORY_CARE_PROVIDER_SITE_OTHER): Payer: 59

## 2015-01-11 VITALS — BP 128/80 | HR 77 | Ht 66.0 in | Wt 213.0 lb

## 2015-01-11 DIAGNOSIS — S52502D Unspecified fracture of the lower end of left radius, subsequent encounter for closed fracture with routine healing: Secondary | ICD-10-CM

## 2015-01-11 NOTE — Assessment & Plan Note (Signed)
Patient is completely healed at this time. Patient did work with a lytic treated today to learn some of the exercises. We showed her in great detail. In addition of that patient will try topical anti-inflammatory we discussed other over-the-counter medications as well as nutritional changes that can help. Patient will come back again in 3 weeks to make sure she should be completely healed at that time.

## 2015-01-11 NOTE — Patient Instructions (Signed)
Good to see you Connie Ho is your friend Tar cherry extract at night Continue the uloric.  Exercises 3 times a week.  See me again in 3 weeks call me though in 1 week and if not better we will do prednisone.

## 2015-01-11 NOTE — Progress Notes (Signed)
  [name] [place], [place] [address] Phone: [phone] Subjective:     CC: Left wrist pain after fall  ATF:TDDUKGURKY Connie Ho is a 54 y.o. female coming in with complaint of left wrist pain after fall. Patient did fall on December 12 and was seen in emergency room. Patient did bring x-rays. Reviewing patient's x-rays patient did have a nondisplaced fracture of the distal radius. Patient's was seen by me and I'll dictate patient in a race. Patient is here for further follow-up. Patient states that the pain is improved. Patient states that overall no new symptoms. Patient states that she is doing well. Patient is having some mild swelling of the wrist but overall at that.  New x-rays were taken today. X-rays show the patient does have good healing within the distal radius fracture with no displacement.     Past medical history, social, surgical and family history all reviewed in electronic medical record.   Review of Systems: No headache, visual changes, nausea, vomiting, diarrhea, constipation, dizziness, abdominal pain, skin rash, fevers, chills, night sweats, weight loss, swollen lymph nodes, body aches, joint swelling, muscle aches, chest pain, shortness of breath, mood changes.   Objective Blood pressure 128/80, pulse 77, height 5\' 6"  (1.676 m), weight 213 lb (96.616 kg), SpO2 96 %.  General: No apparent distress alert and oriented x3 mood and affect normal, dressed appropriately.  HEENT: Pupils equal, extraocular movements intact  Respiratory: Patient's speak in full sentences and does not appear short of breath  Cardiovascular: No lower extremity edema, non tender, no erythema  Skin: Warm dry intact with no signs of infection or rash on extremities or on axial skeleton.  Abdomen: Soft nontender  Neuro: Cranial nerves II through XII are intact, neurovascularly intact in all extremities with 2+ DTRs and 2+ pulses.  Lymph: No  lymphadenopathy of posterior or anterior cervical chain or axillae bilaterally.  Gait normal with good balance and coordination.  MSK:  Non tender with full range of motion and good stability and symmetric strength and tone of shoulders, elbows,  hip, knee and ankles bilaterally.  Wrist: Left Inspection normal with no visible erythema or swelling. Range of motion limited with only 5 of extension and 10 of flexion. Non tender over distal radius.  No tenderness over Canal of Gu[place].  MSK US performed of: Left wrist This study was ordered, performed, and interpreted by Charlann Boxer D.O.  Wrist: All extensor compartments visualized and tendons all normal in appearance without fraying, tears, or sheath effusions. Mild effusion noted of the extensor carpi ulnaris ligament. TFCC also has some mild hypoechoic changes Scapholunate ligament intact. Carpal tunnel visualized and median nerve area normal, flexor tendons all normal in appearance without fraying, tears, or sheath effusions. Power doppler signal normal. Since previous area of the distal radius seems to be healed at this time. Still callus formation noted. Some mild remodeling still to go.     IMPRESSION:  Healed distal radius fracture, mild hypoechoic changes of the extensor carpi ulnaris.   Procedure note 70623; 15 additional minutes spent for Therapeutic exercises as stated in above notes.  This included exercises focusing on stretching, strengthening, with significant focus on eccentric aspects.   Proper technique shown and discussed handout in great detail with ATC.  All questions were discussed and answered.     Impression and Recommendations:     This case required medical decision making of moderate complexity.

## 2015-01-11 NOTE — Progress Notes (Signed)
Pre visit review using our clinic review tool, if applicable. No additional management support is needed unless otherwise documented below in the visit note. 

## 2015-01-18 ENCOUNTER — Telehealth: Payer: Self-pay | Admitting: Internal Medicine

## 2015-01-18 MED ORDER — PREDNISONE 50 MG PO TABS: ORAL_TABLET | ORAL | Status: DC

## 2015-01-18 NOTE — Telephone Encounter (Signed)
Pt called said that her Gout is really bad and she wanted to know if she could get some meds called in for it?

## 2015-01-18 NOTE — Telephone Encounter (Signed)
Ask her if she would like prednisone for next 5 days If so then fill.

## 2015-01-18 NOTE — Telephone Encounter (Signed)
Refill done.  

## 2015-02-01 ENCOUNTER — Telehealth: Payer: Self-pay | Admitting: Family Medicine

## 2015-02-01 ENCOUNTER — Ambulatory Visit: Payer: 59 | Admitting: Family Medicine

## 2015-02-01 NOTE — Telephone Encounter (Signed)
Noted  

## 2015-02-01 NOTE — Telephone Encounter (Signed)
No showed for fu today.  Please advise.

## 2015-03-15 ENCOUNTER — Telehealth: Payer: Self-pay | Admitting: Family Medicine

## 2015-03-15 MED ORDER — PREDNISONE 50 MG PO TABS: ORAL_TABLET | ORAL | Status: DC

## 2015-03-15 NOTE — Telephone Encounter (Signed)
Patient called and she will be coming in to see you on 04/15. She has gout. Her right hip, both big toes and elbows are swollen and hurting her real bad. She is still taking her gout medicine, but was wondering if you can call her in maybe some prednisone to [facility]. Her mother is in hospice right now.

## 2015-03-15 NOTE — Telephone Encounter (Signed)
rx sent into pharmacy. Pt made aware.  

## 2015-03-29 ENCOUNTER — Encounter: Payer: Self-pay | Admitting: Family Medicine

## 2015-03-29 ENCOUNTER — Other Ambulatory Visit (INDEPENDENT_AMBULATORY_CARE_PROVIDER_SITE_OTHER): Payer: 59

## 2015-03-29 ENCOUNTER — Ambulatory Visit (INDEPENDENT_AMBULATORY_CARE_PROVIDER_SITE_OTHER): Payer: 59 | Admitting: Family Medicine

## 2015-03-29 VITALS — BP 138/90 | HR 90 | Ht 66.0 in | Wt 222.0 lb

## 2015-03-29 DIAGNOSIS — G894 Chronic pain syndrome: Secondary | ICD-10-CM | POA: Diagnosis not present

## 2015-03-29 DIAGNOSIS — M2550 Pain in unspecified joint: Secondary | ICD-10-CM | POA: Diagnosis not present

## 2015-03-29 LAB — HEPATIC FUNCTION PANEL
ALT: 34 U/L (ref 0–35)
AST: 33 U/L (ref 0–37)
Albumin: 3.8 g/dL (ref 3.5–5.2)
Alkaline Phosphatase: 81 U/L (ref 39–117)
Bilirubin, Direct: 0.1 mg/dL (ref 0.0–0.3)
Total Bilirubin: 0.4 mg/dL (ref 0.2–1.2)
Total Protein: 7.1 g/dL (ref 6.0–8.3)

## 2015-03-29 LAB — CBC WITH DIFFERENTIAL/PLATELET
BASOS ABS: 0 10*3/uL (ref 0.0–0.1)
BASOS PCT: 0.5 % (ref 0.0–3.0)
EOS ABS: 0 10*3/uL (ref 0.0–0.7)
Eosinophils Relative: 0.4 % (ref 0.0–5.0)
HCT: 40.9 % (ref 36.0–46.0)
Hemoglobin: 14.2 g/dL (ref 12.0–15.0)
LYMPHS ABS: 2.1 10*3/uL (ref 0.7–4.0)
LYMPHS PCT: 30.4 % (ref 12.0–46.0)
MCHC: 34.8 g/dL (ref 30.0–36.0)
MCV: 87.7 fl (ref 78.0–100.0)
Monocytes Absolute: 0.6 10*3/uL (ref 0.1–1.0)
Monocytes Relative: 8.6 % (ref 3.0–12.0)
NEUTROS ABS: 4.1 10*3/uL (ref 1.4–7.7)
Neutrophils Relative %: 60.1 % (ref 43.0–77.0)
PLATELETS: 241 10*3/uL (ref 150.0–400.0)
RBC: 4.66 Mil/uL (ref 3.87–5.11)
RDW: 12.3 % (ref 11.5–15.5)
WBC: 6.8 10*3/uL (ref 4.0–10.5)

## 2015-03-29 LAB — T3, FREE: T3, Free: 3.7 pg/mL (ref 2.3–4.2)

## 2015-03-29 LAB — CORTISOL: CORTISOL PLASMA: 5.4 ug/dL

## 2015-03-29 LAB — TSH: TSH: 1.65 u[IU]/mL (ref 0.35–4.50)

## 2015-03-29 LAB — SEDIMENTATION RATE: Sed Rate: 28 mm/hr — ABNORMAL HIGH (ref 0–22)

## 2015-03-29 LAB — T4, FREE: Free T4: 0.65 ng/dL (ref 0.60–1.60)

## 2015-03-29 LAB — C-REACTIVE PROTEIN: CRP: 0.8 mg/dL (ref 0.5–20.0)

## 2015-03-29 MED ORDER — METHYLPREDNISOLONE ACETATE 80 MG/ML IJ SUSP
80.0000 mg | Freq: Once | INTRAMUSCULAR | Status: AC
  Administered 2015-03-29: 80 mg via INTRAMUSCULAR

## 2015-03-29 MED ORDER — KETOROLAC TROMETHAMINE 60 MG/2ML IM SOLN
60.0000 mg | Freq: Once | INTRAMUSCULAR | Status: AC
  Administered 2015-03-29: 60 mg via INTRAMUSCULAR

## 2015-03-29 MED ORDER — LEVOTHYROXINE SODIUM 50 MCG PO TABS: 50.0000 ug | ORAL_TABLET | Freq: Every day | ORAL | Status: DC

## 2015-03-29 NOTE — Assessment & Plan Note (Signed)
Discussed with patient again at great length. I do believe the patient's symptoms thyroid could still be a possibility. Patient is also had a recent loss of her mother which has been stressful. Patient has worked her way off of the Effexor at this time. There is a chance that a antidepressant medication will be necessary. At this point though we will get labs to see if anything else such as hypercalcemia or adrenal fatigue could also be contributing. Patient will be started on a low dose of Synthroid. We discussed the possible side effects. Patient will follow-up in about 10-14 days to make sure that she is improving.  Spent  25 minutes with patient face-to-face and had greater than 50% of counseling including as described above in assessment and plan.

## 2015-03-29 NOTE — Progress Notes (Signed)
  [name] [place], [place] [address] Phone: [phone] Subjective:      CC:  Crystal arthropathy, fibromyalgia  TIW:Connie Ho Connie Ho is a 54 y.o. female coming in with complaint of bilateral arm pain. 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  He should also has fibromyalgia , chronic pain syndrome. Patient had been doing fairly well but unfortunately is starting have significant aching all over. Patient did have the recent loss of her mother 1 week ago.  Patient is also had some nausea and has seen another provider recently and was told that she had elevated liver enzymes. Patient states that she is just not feeling like herself. Patient is having difficulty getting out of bed in the morning. Significant pain on both her back and bilateral hips. Denies any numbness in the legs but just soreness and pain everywhere.     Past medical history, social, surgical and family history all reviewed in electronic medical record.   Review of Systems: No headache, visual changes, nausea, vomiting, diarrhea, constipation, dizziness, abdominal pain, skin rash, fevers, chills, night sweats, weight loss, swollen lymph nodes, body aches, joint swelling, muscle aches, chest pain, shortness of breath, mood changes.   Objective Blood pressure 138/90, pulse 90, height 5\' 6"  (1.676 m), weight 222 lb (100.699 kg), SpO2 98 %.  General: No apparent distress alert and oriented x3 mood and affect normal, dressed appropriately.  HEENT: Pupils equal, extraocular movements intact  Respiratory: Patient's speak in full sentences and does not appear short of breath  Cardiovascular: No lower extremity edema, non tender, no erythema  Skin: Warm dry intact with no signs of infection or rash on extremities or on axial skeleton.  Abdomen: Soft nontender  Neuro: Cranial nerves II through XII are intact, neurovascularly intact in  all extremities with 2+ DTRs and 2+ pulses.  Lymph: No lymphadenopathy of posterior or anterior cervical chain or axillae bilaterally.  Gait antalgic gait secondary to stiffness MSK:  Non tender with full range of motion and good stability and symmetric strength and tone of shoulders,  wrist,  knee and ankles bilaterally. Multiple aches and pains in multiple areas seem to be more joint related and not muscular related. Patient has significant difficulty getting out of a chair No specific findings with a negative straight leg test but significant tightness of all her musculature.     Impression and Recommendations:     This case required medical decision making of moderate complexity.

## 2015-03-29 NOTE — Patient Instructions (Addendum)
Good to see you We will get labs today 2 injections today Synthroid take daily Half the uloric We will discuss labs when we get them and anything else we can do.  See me again in 1-2 weeks.

## 2015-03-29 NOTE — Addendum Note (Signed)
Addended by: Douglass Rivers T on: 03/29/2015 10:04 AM   Modules accepted: Orders

## 2015-03-29 NOTE — Progress Notes (Signed)
Pre visit review using our clinic review tool, if applicable. No additional management support is needed unless otherwise documented below in the visit note. 

## 2015-04-01 LAB — PTH, INTACT AND CALCIUM
Calcium: 9.4 mg/dL (ref 8.4–10.5)
PTH: 42 pg/mL (ref 14–64)

## 2015-04-03 LAB — DHEA: DHEA: 86 ng/dL — ABNORMAL LOW (ref 102–1185)

## 2015-04-09 ENCOUNTER — Ambulatory Visit (INDEPENDENT_AMBULATORY_CARE_PROVIDER_SITE_OTHER): Payer: 59 | Admitting: Family Medicine

## 2015-04-09 ENCOUNTER — Encounter: Payer: Self-pay | Admitting: Physician Assistant

## 2015-04-09 ENCOUNTER — Encounter: Payer: Self-pay | Admitting: Family Medicine

## 2015-04-09 ENCOUNTER — Other Ambulatory Visit (INDEPENDENT_AMBULATORY_CARE_PROVIDER_SITE_OTHER): Payer: 59

## 2015-04-09 VITALS — BP 136/84 | HR 79 | Ht 66.0 in | Wt 216.0 lb

## 2015-04-09 DIAGNOSIS — R7309 Other abnormal glucose: Secondary | ICD-10-CM

## 2015-04-09 DIAGNOSIS — R110 Nausea: Secondary | ICD-10-CM | POA: Diagnosis not present

## 2015-04-09 DIAGNOSIS — R739 Hyperglycemia, unspecified: Secondary | ICD-10-CM

## 2015-04-09 DIAGNOSIS — G894 Chronic pain syndrome: Secondary | ICD-10-CM | POA: Diagnosis not present

## 2015-04-09 DIAGNOSIS — R112 Nausea with vomiting, unspecified: Secondary | ICD-10-CM

## 2015-04-09 LAB — GLUCOSE, POCT (MANUAL RESULT ENTRY): POC Glucose: 154 mg/dl — AB (ref 70–99)

## 2015-04-09 LAB — HEMOGLOBIN A1C: HEMOGLOBIN A1C: 6.3 % (ref 4.6–6.5)

## 2015-04-09 MED ORDER — ONDANSETRON 4 MG PO TBDP: 4.0000 mg | ORAL_TABLET | Freq: Three times a day (TID) | ORAL | Status: DC | PRN

## 2015-04-09 MED ORDER — METFORMIN HCL 500 MG PO TABS: 500.0000 mg | ORAL_TABLET | Freq: Two times a day (BID) | ORAL | Status: DC

## 2015-04-09 NOTE — Progress Notes (Signed)
Pre visit review using our clinic review tool, if applicable. No additional management support is needed unless otherwise documented below in the visit note. 

## 2015-04-09 NOTE — Assessment & Plan Note (Signed)
Continues to have difficulty with the chronic pain syndrome. Patient is also having elevated blood sugars and I think that diabetes could be playing a role. A1c is ordered today to see if this is contributing. Patient has been actually referred to endocrinology as well. Patient will continue the medications that we have done at this time and made significant other changes. Patient and will come back and see me again in 2-3 weeks for further evaluation and treatment.  Spent  25 minutes with patient face-to-face and had greater than 50% of counseling including as described above in assessment and plan.

## 2015-04-09 NOTE — Progress Notes (Signed)
[name] [place], [place] [address] Phone: [phone] Subjective:      CC:  Crystal arthropathy, fibromyalgia  YTK:PTWSFKCLEX Connie Ho is a 54 y.o. female coming in with complaint of bilateral arm pain. 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  He should also has fibromyalgia , chronic pain syndrome.  Patient was having increasing stress in her life and had signs and symptoms for adrenal fatigue. Patient had labs showing a low DHEA as well as cortisol levels. There is a concern the patient was having also a subacute hypothyroidism and patient was put on a low dose of Synthroid. This is also started due to refractory fibromyalgia and chronic pain. Patient was not finding any significant improvement with the treatment of crystal arthropathy recently. Patient states the gout medication patient is stopping has not notice any significant change. Patient though has been checking her blood sugar with her mother's device who recently passed away and has noticed that she has had blood sugars in the 200s when she wakes up. Patient is not comfortable going back to the primary care provider at this time is wondering what she should do with these elevated blood pressures. Patient did start on the Synthroid but has not notice any significant improvement and only started to DHEA 2 days ago. Patient does not make any significant changes but is hoping that we'll make some changes.  Patient is also complaining of 2 weeks and nausea. Patient states that from time to time she has actually intermittent vomiting. Her primary care rider has referred her to a gastroenterologist but no appointment has been made. Patient continues to have difficulties but denies any fevers, chills, or any abnormal weight loss. States that it seems to be more intermittent. Denies any significant stomach pain or any association with eating or without  eating. Patient is wondering if she could be referred to a gastroenterologist.     Past medical history, social, surgical and family history all reviewed in electronic medical record.   Review of Systems: No headache, visual changes, nausea, vomiting, diarrhea, constipation, dizziness, abdominal pain, skin rash, fevers, chills, night sweats, weight loss, swollen lymph nodes, body aches, joint swelling, muscle aches, chest pain, shortness of breath, mood changes.   Objective Blood pressure 136/84, pulse 79, height 5\' 6"  (1.676 m), weight 216 lb (97.977 kg), SpO2 97 %.  General: No apparent distress alert and oriented x3 mood and affect normal, dressed appropriately.  HEENT: Pupils equal, extraocular movements intact  Respiratory: Patient's speak in full sentences and does not appear short of breath  Cardiovascular: No lower extremity edema, non tender, no erythema  Skin: Warm dry intact with no signs of infection or rash on extremities or on axial skeleton.  Abdomen: Soft nontender  Neuro: Cranial nerves II through XII are intact, neurovascularly intact in all extremities with 2+ DTRs and 2+ pulses.  Lymph: No lymphadenopathy of posterior or anterior cervical chain or axillae bilaterally.  Gait antalgic gait secondary to stiffness MSK:  Non tender with full range of motion and good stability and symmetric strength and tone of shoulders,  wrist,  knee and ankles bilaterally. Multiple aches and pains in multiple areas seem to be more joint related and not muscular related. Patient has significant difficulty getting out of a chair No specific findings with a negative straight leg test but significant tightness of all her musculature.     Impression and Recommendations:  This case required medical decision making of moderate complexity.

## 2015-04-09 NOTE — Patient Instructions (Addendum)
Good to see you Ice is your friend Stop the gout medicine Zofran for nausea.  Metformin 500mg  daily for first week then 2 times daily thereafter.  Endocrinology and GI will call you See me again in 2-3 weeks with you checking your blood sugar 2 times daily.

## 2015-04-24 ENCOUNTER — Other Ambulatory Visit: Payer: Self-pay | Admitting: *Deleted

## 2015-04-24 ENCOUNTER — Ambulatory Visit (INDEPENDENT_AMBULATORY_CARE_PROVIDER_SITE_OTHER)
Admission: RE | Admit: 2015-04-24 | Discharge: 2015-04-24 | Disposition: A | Discharge status: Home / Self Care | Payer: 59 | Source: Ambulatory Visit | Attending: Physician Assistant | Admitting: Physician Assistant

## 2015-04-24 ENCOUNTER — Ambulatory Visit (INDEPENDENT_AMBULATORY_CARE_PROVIDER_SITE_OTHER): Payer: 59 | Admitting: Physician Assistant

## 2015-04-24 ENCOUNTER — Ambulatory Visit (INDEPENDENT_AMBULATORY_CARE_PROVIDER_SITE_OTHER): Payer: 59 | Admitting: Endocrinology

## 2015-04-24 ENCOUNTER — Other Ambulatory Visit (INDEPENDENT_AMBULATORY_CARE_PROVIDER_SITE_OTHER): Payer: 59

## 2015-04-24 ENCOUNTER — Encounter: Payer: Self-pay | Admitting: Physician Assistant

## 2015-04-24 VITALS — BP 136/74 | HR 68 | Ht 66.0 in | Wt 212.0 lb

## 2015-04-24 VITALS — BP 124/80 | HR 66 | Temp 97.8°F | Ht 66.0 in | Wt 212.0 lb

## 2015-04-24 DIAGNOSIS — R1013 Epigastric pain: Secondary | ICD-10-CM | POA: Diagnosis not present

## 2015-04-24 DIAGNOSIS — R1032 Left lower quadrant pain: Secondary | ICD-10-CM

## 2015-04-24 DIAGNOSIS — R7989 Other specified abnormal findings of blood chemistry: Secondary | ICD-10-CM

## 2015-04-24 DIAGNOSIS — R194 Change in bowel habit: Secondary | ICD-10-CM

## 2015-04-24 DIAGNOSIS — Z1212 Encounter for screening for malignant neoplasm of rectum: Secondary | ICD-10-CM

## 2015-04-24 DIAGNOSIS — R112 Nausea with vomiting, unspecified: Secondary | ICD-10-CM

## 2015-04-24 DIAGNOSIS — N209 Urinary calculus, unspecified: Secondary | ICD-10-CM | POA: Insufficient documentation

## 2015-04-24 DIAGNOSIS — E039 Hypothyroidism, unspecified: Secondary | ICD-10-CM | POA: Diagnosis not present

## 2015-04-24 DIAGNOSIS — E279 Disorder of adrenal gland, unspecified: Secondary | ICD-10-CM | POA: Diagnosis not present

## 2015-04-24 DIAGNOSIS — B182 Chronic viral hepatitis C: Secondary | ICD-10-CM

## 2015-04-24 DIAGNOSIS — N202 Calculus of kidney with calculus of ureter: Secondary | ICD-10-CM | POA: Diagnosis not present

## 2015-04-24 DIAGNOSIS — R945 Abnormal results of liver function studies: Secondary | ICD-10-CM

## 2015-04-24 DIAGNOSIS — Z1211 Encounter for screening for malignant neoplasm of colon: Secondary | ICD-10-CM

## 2015-04-24 LAB — BASIC METABOLIC PANEL
BUN: 14 mg/dL (ref 6–23)
CO2: 29 mEq/L (ref 19–32)
Calcium: 10.6 mg/dL — ABNORMAL HIGH (ref 8.4–10.5)
Chloride: 99 mEq/L (ref 96–112)
Creatinine, Ser: 0.64 mg/dL (ref 0.40–1.20)
GFR: 102.99 mL/min (ref >60.00)
GLUCOSE: 114 mg/dL — AB (ref 70–99)
POTASSIUM: 4.2 meq/L (ref 3.5–5.1)
Sodium: 136 mEq/L (ref 135–145)

## 2015-04-24 LAB — CBC WITH DIFFERENTIAL/PLATELET
BASOS ABS: 0 10*3/uL (ref 0.0–0.1)
Basophils Relative: 0.4 % (ref 0.0–3.0)
Eosinophils Absolute: 0.1 10*3/uL (ref 0.0–0.7)
Eosinophils Relative: 0.7 % (ref 0.0–5.0)
HCT: 47.5 % — ABNORMAL HIGH (ref 36.0–46.0)
HEMOGLOBIN: 16.6 g/dL — AB (ref 12.0–15.0)
LYMPHS ABS: 2.3 10*3/uL (ref 0.7–4.0)
LYMPHS PCT: 22.8 % (ref 12.0–46.0)
MCHC: 35 g/dL (ref 30.0–36.0)
MCV: 88.8 fl (ref 78.0–100.0)
Monocytes Absolute: 0.8 10*3/uL (ref 0.1–1.0)
Monocytes Relative: 7.8 % (ref 3.0–12.0)
NEUTROS ABS: 6.9 10*3/uL (ref 1.4–7.7)
Neutrophils Relative %: 68.3 % (ref 43.0–77.0)
Platelets: 291 10*3/uL (ref 150.0–400.0)
RBC: 5.34 Mil/uL — ABNORMAL HIGH (ref 3.87–5.11)
RDW: 12.8 % (ref 11.5–15.5)
WBC: 10.2 10*3/uL (ref 4.0–10.5)

## 2015-04-24 LAB — HEPATIC FUNCTION PANEL
ALBUMIN: 4.6 g/dL (ref 3.5–5.2)
ALK PHOS: 95 U/L (ref 39–117)
ALT: 52 U/L — AB (ref 0–35)
AST: 37 U/L (ref 0–37)
Bilirubin, Direct: 0.2 mg/dL (ref 0.0–0.3)
Total Bilirubin: 0.8 mg/dL (ref 0.2–1.2)
Total Protein: 8.5 g/dL — ABNORMAL HIGH (ref 6.0–8.3)

## 2015-04-24 LAB — TSH: TSH: 0.58 u[IU]/mL (ref 0.35–4.50)

## 2015-04-24 LAB — IGA: IGA: 373 mg/dL (ref 68–378)

## 2015-04-24 LAB — CORTISOL
Cortisol, Plasma: 0.5 ug/dL
Cortisol, Plasma: 3.2 ug/dL

## 2015-04-24 LAB — LIPASE: Lipase: 17 U/L (ref 11.0–59.0)

## 2015-04-24 MED ORDER — METFORMIN HCL ER 500 MG PO TB24: 1000.0000 mg | ORAL_TABLET | Freq: Every day | ORAL | Status: DC

## 2015-04-24 MED ORDER — IOHEXOL 300 MG/ML  SOLN
100.0000 mL | Freq: Once | INTRAMUSCULAR | Status: AC | PRN
  Administered 2015-04-24: 100 mL via INTRAVENOUS

## 2015-04-24 MED ORDER — PEG-KCL-NACL-NASULF-NA ASC-C 100 G PO SOLR: 1.0000 | Freq: Once | ORAL | Status: DC

## 2015-04-24 MED ORDER — COSYNTROPIN 0.25 MG IJ SOLR
0.2500 mg | Freq: Once | INTRAMUSCULAR | Status: AC
  Administered 2015-04-24: 0.25 mg via INTRAMUSCULAR

## 2015-04-24 NOTE — Patient Instructions (Addendum)
Your physician has requested that you go to the basement for lab work before leaving today.  Continue your pantoprazole and zofran.   You have been scheduled for a CT scan of the abdomen and pelvis at [facility] ([address]---this is in the same building as [facility]).   You are scheduled on 04/24/15 at 11:00am. You should arrive 15 minutes prior to your appointment time for registration. Please follow the written instructions below on the day of your exam:  WARNING: IF YOU ARE ALLERGIC TO IODINE/X-RAY DYE, PLEASE NOTIFY RADIOLOGY IMMEDIATELY AT [phone]! YOU WILL BE GIVEN A 13 HOUR PREMEDICATION PREP.  1) Do not eat or drink anything after now. (4 hours prior to your test) 2) You have been given 2 bottles of oral contrast to drink. The solution may taste better if refrigerated, but do NOT add ice or any other liquid to this solution. Shake well before drinking.    Drink 1 bottle of contrast @ 9:00am (2 hours prior to your exam)  Drink 1 bottle of contrast @ 10:00am (1 hour prior to your exam)  You may take any medications as prescribed with a small amount of water except for the following: Metformin, Glucophage, Glucovance, Avandamet, Riomet, Fortamet, Actoplus Met, Janumet, Glumetza or Metaglip. The above medications must be held the day of the exam AND 48 hours after the exam.  The purpose of you drinking the oral contrast is to aid in the visualization of your intestinal tract. The contrast solution may cause some diarrhea. Before your exam is started, you will be given a small amount of fluid to drink. Depending on your individual set of symptoms, you may also receive an intravenous injection of x-ray contrast/dye. Plan on being at [facility] for 30 minutes or long, depending on the type of exam you are having performed.  This test typically takes 30-45 minutes to complete.  If you have any questions regarding your exam or if you need to reschedule,  you may call the CT department at [phone] between the hours of 8:00 am and 5:00 pm, Monday-Friday.  ________________________________________________________________________ Dennis Bast have been scheduled for an endoscopy and colonoscopy. Please follow the written instructions given to you at your visit today. Please pick up your prep supplies at the pharmacy within the next 1-3 days. If you use inhalers (even only as needed), please bring them with you on the day of your procedure. Your physician has requested that you go to www.startemmi.com and enter the access code given to you at your visit today. This web site gives a general overview about your procedure. However, you should still follow specific instructions given to you by our office regarding your preparation for the procedure.

## 2015-04-24 NOTE — Patient Instructions (Addendum)
good diet and exercise significantly improve the control of your diabetes.  please let me know if you wish to be referred to a dietician.  high blood sugar is very risky to your health.  you should see an eye doctor and dentist every year.  It is very important to get all recommended vaccinations.  controlling your blood pressure and cholesterol drastically reduces the damage diabetes does to your body.  Those who smoke should quit.  please discuss these with your doctor.  blood tests are requested for you today.  We'll let you know about the results.  i have sent a prescription to your pharmacy, to change the metformin to -XR.  i hope this helps the nausea and diarrhea.  I would be happy to see you back here whenever you want.

## 2015-04-24 NOTE — Progress Notes (Signed)
Patient ID: ANAE HAMS, female   DOB: 10/26/1961, 54 y.o.   MRN: [id number]    HPI:  Connie Ho is a 54 y.o.   female  referred by Hulan Saas, DO,   She has felt very fatigued and sluggishEvaluation of nausea , epigastric pain, and left lower quadrant abdominal pain.    Ivyana is accompanied to this visit by her daughter. Malayjah is somewhat of a poor historian as is her daughter, and much of her history is obtained through review of old records in epic. Yoshiye has had much of her care at [facility] and at various providers in [place]. She has a history of coronary artery disease, hyperlipidemia, migraines, SVT, asthma, hypertension, hepatitis C, cardiomegaly, hematuria, gouty arthritis, and polycythemia. She presents today with a 2-3 month history of nausea. She states in February she went to Dr. Woody Seller  With a "stomach virus". She was given Phenergan for a few days but since then vomits every other week. She is nauseous all the time and states her nausea is worse on an empty stomach. She has epigastric pain N pain into the left upper quadrant. She feels very bloated. She reports that her liver enzymes were elevated a few months ago. She had been started on a new medication for her gout and when her liver enzymes were noted to be elevated she was advised to stop the medication and her labs normalized. She states she has a long-standing history of GERD and has been on Protonix twice a day for several years. She reports that she had an EGD at [facility] 5-7 years ago. She doesn't know the results and doesn't know the name of the endoscopist. She has no dysphagia but feels like food sits in her esophagus and stomach and doesn't move well. He has also been having left sided abdominal pain for several weeks that has been getting worse, especially in the left lower quadrant. She reports that she normally skips a day or 2 between bowel movements but was recently started on metformin and is having  2-3 bowel movements daily. Her stools are not oily but they have been a light tan in color. Her appetite has been decreased and her weight has been stable. There is no known family history of colon cancer, colon polyps, or inflammatory bowel disease. However her mother had diverticulosis and had pancreatic cancer. Her maternal age use of alcohol and NSAIDs.    Eleesha states she went to donate blood at the [facility] in 1980 and was told she had hepatitis C she states she had a liver biopsy aunts "everything was okay" she is unaware of any risk factors for hepatitis C. He also states "something was wrong with my blood" and she was referred to a "cancer doctor" in [place] 10-12 years ago. She says her blood "got too thick. She has not followed up with that physician. Of her chart shows that she carries a history of polycythemia from years ago. She also has a history of hematuria in 2014 and 2015 and reports that she has had several cystoscopies at [facility]. She does not know what they showed and has not followed up with urology. She has felt very fatigued and sluggish for the past several months.   Past Medical History  Diagnosis Date  . Chest pain, unspecified 2008    Cardiac Catheterization   . Palpitations   . Pure hypercholesterolemia   . Unspecified asthma(493.90)   . Postinflammatory pulmonary fibrosis   .  Cardiomegaly   . Polycythemia vera(238.4)   . Chronic hepatitis C without mention of hepatic coma   . Obesity, unspecified   . Other and unspecified hyperlipidemia   . Unspecified asthma(493.90)   . Degenerative disk disease   . Other premature beats   . First degree atrioventricular block   . Unspecified essential hypertension   . PSVT (paroxysmal supraventricular tachycardia)   . CAD (coronary artery disease)     Nonobstructive, 09/2011 (false positive GXT)  . HTN (hypertension)     Past Surgical History  Procedure Laterality Date  . Cholecystectomy      post  . Abdominal  hysterectomy      Bilateral salpingo-oophorectomy  . Sinus surgery with instatrak  1995  . Cardiac catheterization  2008    mild CAD. 50% diagonal stenosis.    Family History  Problem Relation Age of Onset  . Stroke Father 39    Deceased  . Coronary artery disease Mother   . Heart failure Father   . Atrial fibrillation Mother     Living, 38  . Hypertension Mother   . Diabetes type II Mother   . Fibromyalgia Sister    History  Substance Use Topics  . Smoking status: Never Smoker   . Smokeless tobacco: Never Used  . Alcohol Use: No   Current Outpatient Prescriptions  Medication Sig Dispense Refill  . albuterol (PROVENTIL) (2.5 MG/3ML) 0.083% nebulizer solution Take 2.5 mg by nebulization every 6 (six) hours as needed for wheezing.    . diltiazem (TIAZAC) 120 MG 24 hr capsule Take 1 capsule (120 mg total) by mouth 2 (two) times daily. 60 capsule 6  . ESTRACE VAGINAL 0.1 MG/GM vaginal cream     . levothyroxine (SYNTHROID, LEVOTHROID) 50 MCG tablet Take 1 tablet (50 mcg total) by mouth daily. 30 tablet 3  . losartan (COZAAR) 50 MG tablet Take 1 tablet (50 mg total) by mouth daily. 30 tablet 3  . metFORMIN (GLUCOPHAGE) 500 MG tablet Take 1 tablet (500 mg total) by mouth 2 (two) times daily with a meal. 180 tablet 3  . ondansetron (ZOFRAN ODT) 4 MG disintegrating tablet Take 1 tablet (4 mg total) by mouth every 8 (eight) hours as needed for nausea or vomiting. 20 tablet 0  . pantoprazole (PROTONIX) 40 MG tablet Take 1 tablet (40 mg total) by mouth 2 (two) times daily. 180 tablet 1  . VENTOLIN HFA 108 (90 BASE) MCG/ACT inhaler Inhale 2 puffs into the lungs every 6 (six) hours as needed.     . Diclofenac Sodium (PENNSAID) 2 % SOLN Place 2 application onto the skin 2 (two) times daily. (Patient not taking: Reported on 04/24/2015) 112 g 3  . peg 3350 powder (MOVIPREP) 100 G SOLR Take 1 kit (200 g total) by mouth once. 1 kit 0  . traMADol (ULTRAM) 50 MG tablet Take 1 tablet (50 mg total) by  mouth at bedtime as needed. (Patient not taking: Reported on 04/24/2015) 30 tablet 0   No current facility-administered medications for this visit.   Facility-Administered Medications Ordered in Other Visits  Medication Dose Route Frequency Provider Last Rate Last Dose  . iohexol (OMNIPAQUE) 300 MG/ML solution 100 mL  100 mL Intravenous Once PRN Medication Radiologist, MD       Allergies  Allergen Reactions  . Peanut-Containing Drug Products   . Aspirin Hives  . Ibuprofen Hives     Review of Systems:  per history of present illness otherwise negative    Physical  Exam: BP 136/74 mmHg  Pulse 68  Ht 5' 6"  (1.676 m)  Wt 212 lb (96.163 kg)  BMI 34.23 kg/m2 Constitutional: Pleasant,well-developed female in no acute distress. HEENT: Normocephalic and atraumatic. Conjunctivae are normal. No scleral icterus. Neck supple.  No cervical adenopathy Cardiovascular: Normal rate, regular rhythm.  Pulmonary/chest: Effort normal and breath sounds normal. No wheezing, rales or rhonchi. Abdominal: Soft,mild distention,tender left side of abdomen with guarding, no rebound, no CVAT, Bowel sounds active throughout. There are no masses palpable. No hepatomegaly. Rectal: light tan-colored stool heme negative. Extremities: no edema Lymphadenopathy: No cervical adenopathy noted. Neurological: Alert and oriented to person place and time. Skin: Skin is warm and dry. No rashes noted. Psychiatric: Normal mood and affect. Behavior is normal.  ASSESSMENT AND PLAN:  #1. Left-sided abdominal pain. A CBC and basic metabolic panel will be obtained along with a lipase and hepatic function panel. Patient will undergo a CT of the abdomen and pelvis today to evaluate for possible diverticulitis as well as to evaluate for any other intra-abdominal abnormalities.    #2. Epigastric pain and nausea. This may be due to her GERD however there is an increased incidence of epigastric distress, peptic ulcers, and  gastroduodenal erosions in people with polycythemia. Patient will be scheduled for an EGD to evaluate for gastritis, ulcer, etc.The risks, benefits, and alternatives to endoscopy with possible biopsy and possible dilation were discussed with the patient and they consent to proceed.     #3. Colorectal cancer screening. Patient is 7 and has not had any formal colorectal cancer screening. She has been advised to undergo colonoscopy to screen for polyps or neoplasia.The risks, benefits, and alternatives to colonoscopy with possible biopsy and possible polypectomy were discussed with the patient and they consent to proceed.     #4. History of hepatitis C. Patient is unaware if she ever had a viral load drawn and says she has not ever been vaccinated against hepatitis A or hepatitis B. A hepatitis A total antibody, hepatitis B surface antibody, hepatitis B surface antigen, and hepatitis C antibody will be drawn. If patient is not immune to hepatitis B and hepatitis A, vaccination will be offered.     #5. Change in bowel habits.  Patient has been noticing more frequent stools associated with bloating since started on metformin. It was explained that this may be due 2 her metformin. She's been advised to adhere to a high-fiber low-fat diet. An IgA and TTG will also be obtained.   Patient has also signed a medical release forms to get reports from her EGD done at [facility] 5-7 years ago, reports from hematology/oncology in [place] 10-12 years ago , and reports from urology regarding her prior workup for hematuria.    Rasheena Talmadge, Vita Barley PA-C 04/24/2015, 12:00 PM  CC: Hulan Saas, DO

## 2015-04-24 NOTE — Progress Notes (Signed)
Subjective:    Patient ID: Connie Ho, female    DOB: 05/28/1961, 54 y.o.   MRN: [id number]  HPI pt states DM was dx'ed in early 2016; she has mild if any neuropathy of the lower extremities, but she has associated CAD; she has never been on insulin; pt says her diet and exercise are improved recently; she has never had GDM, pancreatitis, severe hypoglycemia or DKA.  She was rx'ed metformin.  cbg's vary from 114-201.  There is no trend throughout the day.   She has severe fatigue.  Pt has recently been on prednisone.   Past Medical History  Diagnosis Date  . Chest pain, unspecified 2008    Cardiac Catheterization   . Palpitations   . Pure hypercholesterolemia   . Unspecified asthma(493.90)   . Postinflammatory pulmonary fibrosis   . Cardiomegaly   . Polycythemia vera(238.4)   . Chronic hepatitis C without mention of hepatic coma   . Obesity, unspecified   . Other and unspecified hyperlipidemia   . Unspecified asthma(493.90)   . Degenerative disk disease   . Other premature beats   . First degree atrioventricular block   . Unspecified essential hypertension   . PSVT (paroxysmal supraventricular tachycardia)   . CAD (coronary artery disease)     Nonobstructive, 09/2011 (false positive GXT)  . HTN (hypertension)     Past Surgical History  Procedure Laterality Date  . Cholecystectomy      post  . Abdominal hysterectomy      Bilateral salpingo-oophorectomy  . Sinus surgery with instatrak  1995  . Cardiac catheterization  2008    mild CAD. 50% diagonal stenosis.     History   Social History  . Marital Status: Married    Spouse Name: STEVEN   . Number of Children: 4  . Years of Education: N/A   Occupational History  . SELF EMPLOYED    Social History Main Topics  . Smoking status: Never Smoker   . Smokeless tobacco: Never Used  . Alcohol Use: No  . Drug Use: No  . Sexual Activity:    Partners: Male   Other Topics Concern  . Not on file   Social History  Narrative   She lives with husband, daughter, mother, and mother-in-law.   She does alterations (own business).   Highest level of education:  High school          Current Outpatient Prescriptions on File Prior to Visit  Medication Sig Dispense Refill  . albuterol (PROVENTIL) (2.5 MG/3ML) 0.083% nebulizer solution Take 2.5 mg by nebulization every 6 (six) hours as needed for wheezing.    . diltiazem (TIAZAC) 120 MG 24 hr capsule Take 1 capsule (120 mg total) by mouth 2 (two) times daily. 60 capsule 6  . ESTRACE VAGINAL 0.1 MG/GM vaginal cream     . levothyroxine (SYNTHROID, LEVOTHROID) 50 MCG tablet Take 1 tablet (50 mcg total) by mouth daily. 30 tablet 3  . losartan (COZAAR) 50 MG tablet Take 1 tablet (50 mg total) by mouth daily. 30 tablet 3  . ondansetron (ZOFRAN ODT) 4 MG disintegrating tablet Take 1 tablet (4 mg total) by mouth every 8 (eight) hours as needed for nausea or vomiting. 20 tablet 0  . pantoprazole (PROTONIX) 40 MG tablet Take 1 tablet (40 mg total) by mouth 2 (two) times daily. 180 tablet 1  . peg 3350 powder (MOVIPREP) 100 G SOLR Take 1 kit (200 g total) by mouth once. 1 kit 0  .  traMADol (ULTRAM) 50 MG tablet Take 1 tablet (50 mg total) by mouth at bedtime as needed. 30 tablet 0  . VENTOLIN HFA 108 (90 BASE) MCG/ACT inhaler Inhale 2 puffs into the lungs every 6 (six) hours as needed.     . Diclofenac Sodium (PENNSAID) 2 % SOLN Place 2 application onto the skin 2 (two) times daily. (Patient not taking: Reported on 04/24/2015) 112 g 3   No current facility-administered medications on file prior to visit.    Allergies  Allergen Reactions  . Peanut-Containing Drug Products   . Aspirin Hives  . Ibuprofen Hives    Family History  Problem Relation Age of Onset  . Stroke Father 53    Deceased  . Coronary artery disease Mother   . Heart failure Father   . Atrial fibrillation Mother     Living, 35  . Hypertension Mother   . Diabetes type II Mother   . Fibromyalgia  Sister     BP 124/80 mmHg  Pulse 66  Temp(Src) 97.8 F (36.6 C) (Oral)  Ht _0  (1.676 m)  Wt 212 lb (96.163 kg)  BMI 34.23 kg/m2  SpO2 97%  Review of Systems denies blurry vision, headache, chest pain, sob, urinary frequency, muscle cramps, excessive diaphoresis, depression, cold intolerance, and rhinorrhea.  She has slight nausea, diarrhea, leg cramps, easy bruising, and weight gain.      Objective:   Physical Exam VS: see vs page GEN: no distress HEAD: head: no deformity eyes: no periorbital swelling, no proptosis external nose and ears are normal mouth: no lesion seen.   NECK: supple, thyroid is not enlarged CHEST WALL: no deformity.  LUNGS:  Clear to auscultation. CV: reg rate and rhythm, no murmur. ABD: abdomen is soft, nontender.  no hepatosplenomegaly.  not distended.  no hernia. MUSCULOSKELETAL: muscle bulk and strength are grossly normal.  no obvious joint swelling.  gait is normal and steady EXTEMITIES: no deformity.  no ulcer on the feet.  feet are of normal color and temp.  no edema PULSES: dorsalis pedis intact bilat.  no carotid bruit NEURO:  cn 2-12 grossly intact.   readily moves all 4's.  sensation is intact to touch on the feet SKIN:  Normal texture and temperature.  No rash or suspicious lesion is visible.   NODES:  None palpable at the neck.   PSYCH: alert, well-oriented.  Does not appear anxious nor depressed.    outside test results are reviewed: A1c=6.4  Lab Results  Component Value Date   PTH 42 03/29/2015   CALCIUM 10.6* 04/24/2015   acth stimulation test is done: baseline cortisol level=0.5 then cosyntropin 250 mcg is given im 45 minutes later, cortisol level=3 (normal response)  Vit-D=14  Lab Results  Component Value Date   CREATININE 0.64 04/24/2015   BUN 14 04/24/2015   NA 136 04/24/2015   K 4.2 04/24/2015   CL 99 04/24/2015   CO2 29 04/24/2015      Assessment & Plan:  Hypercalcemia: prob due to hyperproteinemia. Vit-D  deficiency: new. Hypocortisolism: severe exacerbation: prob due to recent prednisone.   DM: new Diarrhea: possible due to metformin  Patient is advised the following: Patient Instructions  good diet and exercise significantly improve the control of your diabetes.  please let me know if you wish to be referred to a dietician.  high blood sugar is very risky to your health.  you should see an eye doctor and dentist every year.  It is very important to  get all recommended vaccinations.  controlling your blood pressure and cholesterol drastically reduces the damage diabetes does to your body.  Those who smoke should quit.  please discuss these with your doctor.  blood tests are requested for you today.  We'll let you know about the results.  i have sent a prescription to your pharmacy, to change the metformin to -XR.  i hope this helps the nausea and diarrhea.  I would be happy to see you back here whenever you want.     Addendum: The cortisol is low. However, your other blood tests don't look like addison's disease. The treatment is lifelong steroid therapy, so we should hesitate to undertake that. It is unlikely to make a difference, but you should stop taking the DHEA medication. Please come back for a follow-up appointment in 2 weeks, to recheck this test. Also, your vitamin-D is low. i have sent a prescription to your pharmacy, for this. We'll recheck this then also.

## 2015-04-25 ENCOUNTER — Encounter: Payer: Self-pay | Admitting: Internal Medicine

## 2015-04-25 ENCOUNTER — Telehealth: Payer: Self-pay | Admitting: Endocrinology

## 2015-04-25 LAB — PTH, INTACT AND CALCIUM
Calcium: 10.2 mg/dL (ref 8.4–10.5)
PTH: 50 pg/mL (ref 14–64)

## 2015-04-25 LAB — VITAMIN D 25 HYDROXY (VIT D DEFICIENCY, FRACTURES): VITD: 14.35 ng/mL — ABNORMAL LOW (ref 30.00–100.00)

## 2015-04-25 LAB — HEPATITIS C ANTIBODY: HCV AB: REACTIVE — AB

## 2015-04-25 LAB — HEPATITIS B SURFACE ANTIGEN: Hepatitis B Surface Ag: NEGATIVE

## 2015-04-25 LAB — TISSUE TRANSGLUTAMINASE, IGA: TISSUE TRANSGLUTAMINASE AB, IGA: 1 U/mL (ref <4)

## 2015-04-25 LAB — HEPATITIS A ANTIBODY, TOTAL: HEP A TOTAL AB: NONREACTIVE

## 2015-04-25 LAB — HEPATITIS B SURFACE ANTIBODY,QUALITATIVE: Hep B S Ab: NEGATIVE

## 2015-04-25 MED ORDER — VITAMIN D (ERGOCALCIFEROL) 1.25 MG (50000 UNIT) PO CAPS: ORAL_CAPSULE | ORAL | Status: DC

## 2015-04-25 NOTE — Telephone Encounter (Signed)
please call patient: To add to yesterday's message: the prednisone you were on recently is the most likely cause for the low cortisol blood tests. Let's recheck in a few weeks.

## 2015-04-25 NOTE — Progress Notes (Signed)
Complex history. Agree with initial assessment and plans as outlined

## 2015-04-25 NOTE — Progress Notes (Signed)
   Subjective:    Patient ID: Connie Ho, female    DOB: 24-Sep-1961, 54 y.o.   MRN: [id number]  HPI    Review of Systems     Objective:   Physical Exam    Correction: this is an abnormal cortisol response to ACTH    Assessment & Plan:

## 2015-04-26 ENCOUNTER — Other Ambulatory Visit: Payer: 59

## 2015-04-26 ENCOUNTER — Telehealth: Payer: Self-pay | Admitting: Physician Assistant

## 2015-04-26 ENCOUNTER — Encounter: Payer: Self-pay | Admitting: Physician Assistant

## 2015-04-26 NOTE — Telephone Encounter (Signed)
Patient advised of note below and voiced understanding.  

## 2015-04-26 NOTE — Telephone Encounter (Signed)
Patient saw her results on My Chart.  She wanted to know what the Hep C reactive results ment.  I explained that her Hep C test was positive, but we are waiting for additional results to come back and that we will call with the results when all the tests are back.

## 2015-04-28 LAB — VITAMIN D 1,25 DIHYDROXY
VITAMIN D3 1, 25 (OH): 70 pg/mL
Vitamin D 1, 25 (OH)2 Total: 79 pg/mL — ABNORMAL HIGH (ref 18–72)
Vitamin D2 1, 25 (OH)2: 9 pg/mL

## 2015-04-29 LAB — HEPATITIS C RNA QUANTITATIVE: HCV QUANT: NOT DETECTED [IU]/mL (ref <15)

## 2015-05-01 ENCOUNTER — Other Ambulatory Visit: Payer: Self-pay | Admitting: *Deleted

## 2015-05-01 MED ORDER — LOSARTAN POTASSIUM 50 MG PO TABS: 50.0000 mg | ORAL_TABLET | Freq: Every day | ORAL | Status: DC

## 2015-05-01 NOTE — Telephone Encounter (Signed)
Refill done.  

## 2015-05-03 ENCOUNTER — Ambulatory Visit: Payer: 59 | Admitting: Family Medicine

## 2015-05-03 ENCOUNTER — Telehealth: Payer: Self-pay | Admitting: Endocrinology

## 2015-05-03 DIAGNOSIS — Z0289 Encounter for other administrative examinations: Secondary | ICD-10-CM

## 2015-05-03 MED ORDER — GLUCOSE BLOOD VI STRP: ORAL_STRIP | Status: DC

## 2015-05-03 NOTE — Telephone Encounter (Signed)
Patient would like a refill on her Rx  Rx: One Touch Verio   Pharmacy: [facility]  Thank you

## 2015-05-03 NOTE — Telephone Encounter (Signed)
Rx sent to pharmacy   

## 2015-05-16 ENCOUNTER — Telehealth: Payer: Self-pay | Admitting: *Deleted

## 2015-05-16 DIAGNOSIS — B182 Chronic viral hepatitis C: Secondary | ICD-10-CM

## 2015-05-16 NOTE — Telephone Encounter (Signed)
Left a message for patient to call us and let us know if she is going to get Twin rex.

## 2015-05-16 NOTE — Telephone Encounter (Signed)
-----   Message from Hulan Saas, RN sent at 04/29/2015  4:42 PM EDT ----- Did patient call and set up Twin rex injections?[place]

## 2015-05-17 ENCOUNTER — Ambulatory Visit (AMBULATORY_SURGERY_CENTER): Payer: 59 | Admitting: Internal Medicine

## 2015-05-17 ENCOUNTER — Encounter: Payer: Self-pay | Admitting: Internal Medicine

## 2015-05-17 VITALS — BP 115/86 | HR 64 | Temp 98.4°F | Resp 18 | Ht 66.0 in | Wt 212.0 lb

## 2015-05-17 DIAGNOSIS — Z1211 Encounter for screening for malignant neoplasm of colon: Secondary | ICD-10-CM

## 2015-05-17 DIAGNOSIS — R1032 Left lower quadrant pain: Secondary | ICD-10-CM

## 2015-05-17 DIAGNOSIS — R1013 Epigastric pain: Secondary | ICD-10-CM | POA: Diagnosis not present

## 2015-05-17 DIAGNOSIS — R112 Nausea with vomiting, unspecified: Secondary | ICD-10-CM

## 2015-05-17 MED ORDER — SODIUM CHLORIDE 0.9 % IV SOLN: 500.0000 mL | INTRAVENOUS | Status: DC

## 2015-05-17 NOTE — Patient Instructions (Signed)

## 2015-05-17 NOTE — Op Note (Signed)
[place]  [address]. [place], [id number]   ENDOSCOPY PROCEDURE REPORT  PATIENT: Connie, Ho  MR#: [id number] BIRTHDATE: April 08, 1961 , 89  yrs. old GENDER: female ENDOSCOPIST: Eustace Quail, MD REFERRED BY:  Jerene Bears, M.D. PROCEDURE DATE:  05/17/2015 PROCEDURE:  EGD, diagnostic ASA CLASS:     Class II INDICATIONS:  nausea and epigastric pain. MEDICATIONS: Monitored anesthesia care and Propofol 70 mg IV TOPICAL ANESTHETIC: none  DESCRIPTION OF PROCEDURE: After the risks benefits and alternatives of the procedure were thoroughly explained, informed consent was obtained.  The LB [id number] endoscope was introduced through the mouth and advanced to the second portion of the duodenum , Without limitations.  The instrument was slowly withdrawn as the mucosa was fully examined.      EXAM: The esophagus and gastroesophageal junction were completely normal in appearance.  The stomach was entered and closely examined.The antrum, angularis, and lesser curvature were well visualized, including a retroflexed view of the cardia and fundus. The stomach wall was normally distensable.  The scope passed easily through the pylorus into the duodenum.  Retroflexed views revealed no abnormalities.     The scope was then withdrawn from the patient and the procedure completed.  COMPLICATIONS: There were no immediate complications.  ENDOSCOPIC IMPRESSION: 1. Normal EGD 2. GERD  RECOMMENDATIONS: 1.  Anti-reflux regimen to be followed 2.  Continue current medications  REPEAT EXAM:  eSigned:  Eustace Quail, MD 05/17/2015 3:26 PM    CC:The Patient and Norina Buzzard, MD

## 2015-05-17 NOTE — Progress Notes (Signed)
Report to PACU, RN, vss, BBS= Clear.  

## 2015-05-17 NOTE — Telephone Encounter (Signed)
Patient is having a colonoscopy today. She wants to schedule her Twin rx for next Friday. Appointments made for 05/24/15, 05/31/15 and 06/14/15 at 10:00 AM for Twin Rx. Order in [place].

## 2015-05-17 NOTE — Op Note (Signed)
[place]  [address]. [place], [address]   COLONOSCOPY PROCEDURE REPORT  PATIENT: Connie, Ho  MR#: [id number] BIRTHDATE: [birth year] , 37  yrs. old GENDER: female ENDOSCOPIST: Eustace Quail, MD REFERRED HA:LPFXT Woody Seller, M.D. PROCEDURE DATE:  05/17/2015 PROCEDURE:   Colonoscopy, screening First Screening Colonoscopy - Avg.  risk and is 50 yrs.  old or older Yes.  Prior Negative Screening - Now for repeat screening. N/A  History of Adenoma - Now for follow-up colonoscopy & has been > or = to 3 yrs.  N/A  Polyps removed today? No Recommend repeat exam, <10 yrs? No ASA CLASS:   Class II INDICATIONS:Screening for colonic neoplasia and Colorectal Neoplasm Risk Assessment for this procedure is average risk. MEDICATIONS: Monitored anesthesia care and Propofol 350 mg IV  DESCRIPTION OF PROCEDURE:   After the risks benefits and alternatives of the procedure were thoroughly explained, informed consent was obtained.  The digital rectal exam revealed no abnormalities of the rectum.   The LB [id number]  endoscope was introduced through the anus and advanced to the cecum, which was identified by both the appendix and ileocecal valve. No adverse events experienced.   The quality of the prep was excellent. (MoviPrep was used)  The instrument was then slowly withdrawn as the colon was fully examined. Estimated blood loss is zero unless otherwise noted in this procedure report.      COLON FINDINGS: A normal appearing cecum, ileocecal valve, and appendiceal orifice were identified.  The ascending, transverse, descending, sigmoid colon, and rectum appeared unremarkable. The rectosigmoid junction was fixed consistent with pelvic adhesions. Retroflexed views revealed internal hemorrhoids. The time to cecum = 6.9 Withdrawal time = 7.9   The scope was withdrawn and the procedure completed. COMPLICATIONS: There were no immediate complications.  ENDOSCOPIC  IMPRESSION: 1. Normal colonoscopy 2. Suspect intermittent left lower quadrant discomfort secondary to adhesions  RECOMMENDATIONS: 1.  Continue current colorectal screening recommendations for "routine risk" patients with a repeat colonoscopy in 10 years. 2.  Upper endoscopy today (please see report)  eSigned:  Eustace Quail, MD 05/17/2015 3:23 PM   cc: Norina Buzzard, MD and The Patient

## 2015-05-20 NOTE — Telephone Encounter (Deleted)
No answer, left message to call if questions.

## 2015-05-20 NOTE — Telephone Encounter (Signed)
No answer, left message to call if questions.

## 2015-05-23 ENCOUNTER — Telehealth: Payer: Self-pay | Admitting: *Deleted

## 2015-05-23 NOTE — Telephone Encounter (Signed)
Patient will come for labs.  

## 2015-05-23 NOTE — Telephone Encounter (Signed)
-----   Message from Hulan Saas, RN sent at 04/24/2015  3:56 PM EDT ----- Call and remind patient due for LFT on 05/27/15 for [facility]. Lab in.

## 2015-05-24 ENCOUNTER — Ambulatory Visit (INDEPENDENT_AMBULATORY_CARE_PROVIDER_SITE_OTHER): Payer: 59 | Admitting: Internal Medicine

## 2015-05-24 ENCOUNTER — Ambulatory Visit (INDEPENDENT_AMBULATORY_CARE_PROVIDER_SITE_OTHER): Payer: 59 | Admitting: Endocrinology

## 2015-05-24 ENCOUNTER — Other Ambulatory Visit (INDEPENDENT_AMBULATORY_CARE_PROVIDER_SITE_OTHER): Payer: 59 | Admitting: *Deleted

## 2015-05-24 ENCOUNTER — Encounter: Payer: Self-pay | Admitting: Endocrinology

## 2015-05-24 ENCOUNTER — Other Ambulatory Visit (INDEPENDENT_AMBULATORY_CARE_PROVIDER_SITE_OTHER): Payer: 59

## 2015-05-24 VITALS — BP 138/80 | HR 63 | Temp 98.2°F | Wt 207.0 lb

## 2015-05-24 DIAGNOSIS — B182 Chronic viral hepatitis C: Secondary | ICD-10-CM | POA: Diagnosis not present

## 2015-05-24 DIAGNOSIS — Z23 Encounter for immunization: Secondary | ICD-10-CM | POA: Diagnosis not present

## 2015-05-24 DIAGNOSIS — R945 Abnormal results of liver function studies: Secondary | ICD-10-CM

## 2015-05-24 DIAGNOSIS — E279 Disorder of adrenal gland, unspecified: Secondary | ICD-10-CM

## 2015-05-24 DIAGNOSIS — R7989 Other specified abnormal findings of blood chemistry: Secondary | ICD-10-CM

## 2015-05-24 LAB — CORTISOL
CORTISOL PLASMA: 12.4 ug/dL
Cortisol, Plasma: 6.2 ug/dL

## 2015-05-24 LAB — HEPATIC FUNCTION PANEL
ALT: 28 U/L (ref 0–35)
AST: 26 U/L (ref 0–37)
Albumin: 4.3 g/dL (ref 3.5–5.2)
Alkaline Phosphatase: 74 U/L (ref 39–117)
Bilirubin, Direct: 0.1 mg/dL (ref 0.0–0.3)
TOTAL PROTEIN: 7.4 g/dL (ref 6.0–8.3)
Total Bilirubin: 0.7 mg/dL (ref 0.2–1.2)

## 2015-05-24 MED ORDER — COSYNTROPIN 0.25 MG IJ SOLR
0.2500 mg | Freq: Once | INTRAMUSCULAR | Status: AC
  Administered 2015-05-24: 0.25 mg via INTRAVENOUS

## 2015-05-24 NOTE — Progress Notes (Signed)
Subjective:    Patient ID: Connie Ho, female    DOB: 1961-06-22, 54 y.o.   MRN: [id number]  HPI Pt returns for f/u of diabetes mellitus: DM type: 2 Dx'ed: 1245 Complications: CAD Therapy: metformin GDM: never DKA: never Severe hypoglycemia: never Pancreatitis: never Other: she has never been on insulin. Interval history: she says cbg's are well-controlled.  She was also recently noted to have hypocortisolemia.  pt states she feels well in general.     Past Medical History  Diagnosis Date  . Chest pain, unspecified 2008    Cardiac Catheterization   . Palpitations   . Pure hypercholesterolemia   . Unspecified asthma(493.90)   . Postinflammatory pulmonary fibrosis   . Cardiomegaly   . Polycythemia vera(238.4)   . Chronic hepatitis C without mention of hepatic coma   . Obesity, unspecified   . Other and unspecified hyperlipidemia   . Unspecified asthma(493.90)   . Degenerative disk disease   . Other premature beats   . First degree atrioventricular block   . Unspecified essential hypertension   . PSVT (paroxysmal supraventricular tachycardia)   . CAD (coronary artery disease)     Nonobstructive, 09/2011 (false positive GXT)  . HTN (hypertension)     Past Surgical History  Procedure Laterality Date  . Cholecystectomy      post  . Abdominal hysterectomy      Bilateral salpingo-oophorectomy  . Sinus surgery with instatrak  1995  . Cardiac catheterization  2008    mild CAD. 50% diagonal stenosis.     History   Social History  . Marital Status: Married    Spouse Name: STEVEN   . Number of Children: 4  . Years of Education: N/A   Occupational History  . SELF EMPLOYED    Social History Main Topics  . Smoking status: Never Smoker   . Smokeless tobacco: Never Used  . Alcohol Use: No  . Drug Use: No  . Sexual Activity:    Partners: Male   Other Topics Concern  . Not on file   Social History Narrative   She lives with husband, daughter, mother, and  mother-in-law.   She does alterations (own business).   Highest level of education:  High school          Current Outpatient Prescriptions on File Prior to Visit  Medication Sig Dispense Refill  . albuterol (PROVENTIL) (2.5 MG/3ML) 0.083% nebulizer solution Take 2.5 mg by nebulization every 6 (six) hours as needed for wheezing.    . diltiazem (TIAZAC) 120 MG 24 hr capsule Take 1 capsule (120 mg total) by mouth 2 (two) times daily. 60 capsule 6  . glucose blood (ONETOUCH VERIO) test strip Use to check blood sugar 1 time per day. 100 each 2  . levothyroxine (SYNTHROID, LEVOTHROID) 50 MCG tablet Take 1 tablet (50 mcg total) by mouth daily. 30 tablet 3  . losartan (COZAAR) 50 MG tablet Take 1 tablet (50 mg total) by mouth daily. 90 tablet 1  . metFORMIN (GLUCOPHAGE-XR) 500 MG 24 hr tablet Take 2 tablets (1,000 mg total) by mouth daily with breakfast. 60 tablet 11  . ondansetron (ZOFRAN ODT) 4 MG disintegrating tablet Take 1 tablet (4 mg total) by mouth every 8 (eight) hours as needed for nausea or vomiting. 20 tablet 0  . pantoprazole (PROTONIX) 40 MG tablet Take 1 tablet (40 mg total) by mouth 2 (two) times daily. 180 tablet 1   No current facility-administered medications on file prior to visit.  Allergies  Allergen Reactions  . Peanut-Containing Drug Products   . Aspirin Hives  . Ibuprofen Hives    Family History  Problem Relation Age of Onset  . Stroke Father 60    Deceased  . Coronary artery disease Mother   . Heart failure Father   . Atrial fibrillation Mother     Living, 22  . Hypertension Mother   . Diabetes type II Mother   . Fibromyalgia Sister     BP 138/80 mmHg  Pulse 63  Temp(Src) 98.2 F (36.8 C) (Tympanic)  Wt 207 lb (93.895 kg)  SpO2 99%  Review of Systems She has lost a few lbs, due to her efforts. Nausea is resolved.      Objective:   Physical Exam VITAL SIGNS:  See vs page GENERAL: no distress Ext: no edema Skin: normal pigmentation.   acth  stimulation test is done: baseline cortisol level=6 then cosyntropin 250 mcg is given im 45 minutes later, cortisol level=12 (normal response)     Assessment & Plan:  Hypocortisolemia: prob related to recent prednisone, and possibly also to recent DHEA rx, improved.   DM: apparently well-controlled  Patient is advised the following: Patient Instructions  blood tests are requested for you today.  We'll let you know about the results.      Addendum: Please come back for a follow-up appointment in 1 month.  We'll recheck ACTH test then.

## 2015-05-24 NOTE — Patient Instructions (Addendum)
blood tests are requested for you today.  We'll let you know about the results.  

## 2015-05-31 ENCOUNTER — Ambulatory Visit (INDEPENDENT_AMBULATORY_CARE_PROVIDER_SITE_OTHER): Payer: 59 | Admitting: Internal Medicine

## 2015-05-31 DIAGNOSIS — B182 Chronic viral hepatitis C: Secondary | ICD-10-CM

## 2015-05-31 DIAGNOSIS — Z23 Encounter for immunization: Secondary | ICD-10-CM

## 2015-06-12 ENCOUNTER — Telehealth: Payer: Self-pay | Admitting: Internal Medicine

## 2015-06-12 NOTE — Telephone Encounter (Signed)
Spoke with pt and let her know the scheduled appt for 06/14/15 is the correct date. Pt verbalized understanding.

## 2015-06-14 ENCOUNTER — Ambulatory Visit (INDEPENDENT_AMBULATORY_CARE_PROVIDER_SITE_OTHER): Payer: 59 | Admitting: Internal Medicine

## 2015-06-14 DIAGNOSIS — B182 Chronic viral hepatitis C: Secondary | ICD-10-CM | POA: Diagnosis not present

## 2015-06-14 DIAGNOSIS — Z23 Encounter for immunization: Secondary | ICD-10-CM | POA: Diagnosis not present

## 2015-06-21 ENCOUNTER — Ambulatory Visit (INDEPENDENT_AMBULATORY_CARE_PROVIDER_SITE_OTHER): Payer: 59

## 2015-06-21 ENCOUNTER — Other Ambulatory Visit: Payer: Self-pay

## 2015-06-21 ENCOUNTER — Other Ambulatory Visit (INDEPENDENT_AMBULATORY_CARE_PROVIDER_SITE_OTHER): Payer: 59

## 2015-06-21 DIAGNOSIS — E279 Disorder of adrenal gland, unspecified: Secondary | ICD-10-CM

## 2015-06-21 LAB — CORTISOL
CORTISOL PLASMA: 15.7 ug/dL
Cortisol, Plasma: 7.3 ug/dL

## 2015-06-21 MED ORDER — COSYNTROPIN 0.25 MG IJ SOLR
0.2500 mg | Freq: Once | INTRAMUSCULAR | Status: AC
  Administered 2015-06-21: 0.25 mg via INTRAMUSCULAR

## 2015-08-13 ENCOUNTER — Encounter: Payer: Self-pay | Admitting: Family Medicine

## 2015-08-14 ENCOUNTER — Encounter: Payer: Self-pay | Admitting: Family Medicine

## 2015-08-23 ENCOUNTER — Telehealth: Payer: Self-pay | Admitting: Family Medicine

## 2015-08-23 NOTE — Telephone Encounter (Signed)
Pt called request refill for levothyroxine (SYNTHROID, LEVOTHROID) 50 MCG  To be send to [facility]. Pt stated she is trying to get this refill since August but never heard anything. Please help

## 2015-08-25 MED ORDER — LEVOTHYROXINE SODIUM 50 MCG PO TABS: 50.0000 ug | ORAL_TABLET | Freq: Every day | ORAL | Status: DC

## 2015-08-25 NOTE — Telephone Encounter (Signed)
done

## 2015-10-09 ENCOUNTER — Other Ambulatory Visit: Payer: Self-pay | Admitting: Cardiovascular Disease

## 2015-10-16 ENCOUNTER — Ambulatory Visit: Payer: 59 | Admitting: Cardiovascular Disease

## 2015-10-24 ENCOUNTER — Ambulatory Visit: Payer: 59 | Admitting: Cardiovascular Disease

## 2015-10-24 DIAGNOSIS — R0989 Other specified symptoms and signs involving the circulatory and respiratory systems: Secondary | ICD-10-CM

## 2015-11-14 ENCOUNTER — Encounter: Payer: Self-pay | Admitting: Cardiovascular Disease

## 2015-11-14 ENCOUNTER — Ambulatory Visit (INDEPENDENT_AMBULATORY_CARE_PROVIDER_SITE_OTHER): Payer: 59 | Admitting: Cardiovascular Disease

## 2015-11-14 VITALS — BP 129/89 | HR 77 | Ht 66.0 in | Wt 214.0 lb

## 2015-11-14 DIAGNOSIS — I471 Supraventricular tachycardia: Secondary | ICD-10-CM | POA: Diagnosis not present

## 2015-11-14 DIAGNOSIS — I10 Essential (primary) hypertension: Secondary | ICD-10-CM

## 2015-11-14 DIAGNOSIS — I2510 Atherosclerotic heart disease of native coronary artery without angina pectoris: Secondary | ICD-10-CM | POA: Diagnosis not present

## 2015-11-14 NOTE — Patient Instructions (Signed)
Continue all current medications. Your physician wants you to follow up in:  2 years.  You will receive a reminder letter in the mail one-two months in advance.  If you don't receive a letter, please call our office to schedule the follow up appointment

## 2015-11-14 NOTE — Progress Notes (Signed)
Patient ID: Connie Ho, female   DOB: 03/14/1961, 54 y.o.   MRN: [id number]      SUBJECTIVE: The patient is here to followup for paroxysmal supraventricular tachycardia. She has a history of hypertension and nonobstructive coronary artery disease. She also has a history of asthma and has been treated for PSVT with calcium channel blockers for this reason.  ECG performed in the office today demonstrates normal sinus rhythm with no ischemic ST segment or T-wave abnormalities, nor any arrhythmias.  She seldom has palpitations, primarily when taking prednisone for joint inflammation. These are alleviated by sitting quietly and calming herself down. Lots of anxiety/stress in her life as mother passed away.   Review of Systems: As per "subjective", otherwise negative.  Allergies  Allergen Reactions  . Peanut-Containing Drug Products   . Aspirin Hives  . Ibuprofen Hives    Current Outpatient Prescriptions  Medication Sig Dispense Refill  . albuterol (PROVENTIL) (2.5 MG/3ML) 0.083% nebulizer solution Take 2.5 mg by nebulization every 6 (six) hours as needed for wheezing.    . diclofenac (VOLTAREN) 50 MG EC tablet Take 50 mg by mouth 2 (two) times daily.  2  . diltiazem (TIAZAC) 120 MG 24 hr capsule TAKE ONE CAPSULE BY MOUTH TWICE DAILY 60 capsule 0  . glucose blood (ONETOUCH VERIO) test strip Use to check blood sugar 1 time per day. 100 each 2  . levothyroxine (SYNTHROID, LEVOTHROID) 50 MCG tablet Take 1 tablet (50 mcg total) by mouth daily. 90 tablet 3  . losartan (COZAAR) 50 MG tablet Take 1 tablet (50 mg total) by mouth daily. 90 tablet 1  . metFORMIN (GLUCOPHAGE-XR) 500 MG 24 hr tablet Take 2 tablets (1,000 mg total) by mouth daily with breakfast. 60 tablet 11  . ondansetron (ZOFRAN ODT) 4 MG disintegrating tablet Take 1 tablet (4 mg total) by mouth every 8 (eight) hours as needed for nausea or vomiting. 20 tablet 0  . pantoprazole (PROTONIX) 40 MG tablet Take 1 tablet (40 mg total)  by mouth 2 (two) times daily. 180 tablet 1  . predniSONE (DELTASONE) 10 MG tablet Take 1 tablet by mouth daily.  0   No current facility-administered medications for this visit.    Past Medical History  Diagnosis Date  . Chest pain, unspecified 2008    Cardiac Catheterization   . Palpitations   . Pure hypercholesterolemia   . Unspecified asthma(493.90)   . Postinflammatory pulmonary fibrosis ([place])   . Cardiomegaly   . Polycythemia vera(238.4)   . Chronic hepatitis C without mention of hepatic coma   . Obesity, unspecified   . Other and unspecified hyperlipidemia   . Unspecified asthma(493.90)   . Degenerative disk disease   . Other premature beats   . First degree atrioventricular block   . Unspecified essential hypertension   . PSVT (paroxysmal supraventricular tachycardia) ([place])   . CAD (coronary artery disease)     Nonobstructive, 09/2011 (false positive GXT)  . HTN (hypertension)     Past Surgical History  Procedure Laterality Date  . Cholecystectomy      post  . Abdominal hysterectomy      Bilateral salpingo-oophorectomy  . Sinus surgery with instatrak  1995  . Cardiac catheterization  2008    mild CAD. 50% diagonal stenosis.     Social History   Social History  . Marital Status: Married    Spouse Name: STEVEN   . Number of Children: 4  . Years of Education: N/A   Occupational History  .  SELF EMPLOYED    Social History Main Topics  . Smoking status: Never Smoker   . Smokeless tobacco: Never Used  . Alcohol Use: No  . Drug Use: No  . Sexual Activity:    Partners: Male   Other Topics Concern  . Not on file   Social History Narrative   She lives with husband, daughter, mother, and mother-in-law.   She does alterations (own business).   Highest level of education:  High school           Filed Vitals:   11/14/15 1001  BP: 129/89  Pulse: 77  Height: 5\' 6"  (1.676 m)  Weight: 214 lb (97.07 kg)    PHYSICAL EXAM General: NAD HEENT:  Normal. Neck: No JVD, no thyromegaly. Lungs: Clear to auscultation bilaterally with normal respiratory effort. CV: Nondisplaced PMI.  Regular rate and rhythm, normal S1/S2, no S3/S4, no murmur. No pretibial or periankle edema.  No carotid bruit.  Normal pedal pulses.  Abdomen: Soft, nontender, no hepatosplenomegaly, no distention.  Neurologic: Alert and oriented x 3.  Psych: Normal affect. Skin: Normal. Musculoskeletal: Normal range of motion, no gross deformities. Extremities: No clubbing or cyanosis.   ECG: Most recent ECG reviewed.   Coronary angiography (10-07-2011): STUDY CONCLUSIONS:  1. No significant coronary artery disease. There is a 60% ostial  lesion in a small diagonal branch which is less than 2 mm in  diameter.  2. Normal LV systolic function.  3. Moderately elevated systemic pressure and mildly elevated left  ventricular end-diastolic pressure.   RECOMMENDATIONS: Medical therapy is advised. No revascularization is  indicated. The diagonal branch is medium in size and the diameter is  about 1.5 mm. No angioplasty to this location is recommended.    ASSESSMENT AND PLAN:  PSVT (paroxysmal supraventricular tachycardia)  Symptomatically stable. Continue diltiazem 120 twice a day. No beta blockers due to h/o asthma.  CAD (coronary artery disease)  Nonobstructive as noted above. Symptomatically stable, and had a normal echocardiogram with only significant disease in a small diagonal branch in 09/2011 as noted above.  Essential Hypertension Borderline elevation of DBP. Will monitor.  Dispo: f/u 2 years.  Kate Sable, M.D., F.A.C.C.

## 2015-12-26 ENCOUNTER — Other Ambulatory Visit: Payer: Self-pay | Admitting: *Deleted

## 2015-12-26 MED ORDER — PANTOPRAZOLE SODIUM 40 MG PO TBEC: 40.0000 mg | DELAYED_RELEASE_TABLET | Freq: Two times a day (BID) | ORAL | Status: DC

## 2015-12-26 NOTE — Telephone Encounter (Signed)
Refill done.  

## 2016-01-22 ENCOUNTER — Other Ambulatory Visit: Payer: Self-pay | Admitting: Sports Medicine

## 2016-01-22 DIAGNOSIS — M5136 Other intervertebral disc degeneration, lumbar region: Secondary | ICD-10-CM

## 2016-01-27 ENCOUNTER — Ambulatory Visit
Admission: RE | Admit: 2016-01-27 | Discharge: 2016-01-27 | Disposition: A | Discharge status: Home / Self Care | Payer: 59 | Source: Ambulatory Visit | Attending: Sports Medicine | Admitting: Sports Medicine

## 2016-01-27 DIAGNOSIS — M5136 Other intervertebral disc degeneration, lumbar region: Secondary | ICD-10-CM

## 2016-01-27 MED ORDER — IOHEXOL 180 MG/ML  SOLN
1.0000 mL | Freq: Once | INTRAMUSCULAR | Status: AC | PRN
  Administered 2016-01-27: 1 mL via EPIDURAL

## 2016-01-27 MED ORDER — METHYLPREDNISOLONE ACETATE 40 MG/ML INJ SUSP (RADIOLOG
120.0000 mg | Freq: Once | INTRAMUSCULAR | Status: AC
Start: 2016-01-27 — End: 2016-01-27
  Administered 2016-01-27: 120 mg via EPIDURAL

## 2016-01-27 NOTE — Discharge Instructions (Signed)

## 2016-02-18 ENCOUNTER — Other Ambulatory Visit: Payer: Self-pay | Admitting: Orthopedic Surgery

## 2016-02-18 DIAGNOSIS — M5416 Radiculopathy, lumbar region: Secondary | ICD-10-CM

## 2016-02-24 ENCOUNTER — Ambulatory Visit
Admission: RE | Admit: 2016-02-24 | Discharge: 2016-02-24 | Disposition: A | Discharge status: Home / Self Care | Payer: 59 | Source: Ambulatory Visit | Attending: Orthopedic Surgery | Admitting: Orthopedic Surgery

## 2016-02-24 DIAGNOSIS — M5416 Radiculopathy, lumbar region: Secondary | ICD-10-CM

## 2016-02-24 MED ORDER — IOHEXOL 180 MG/ML  SOLN
1.0000 mL | Freq: Once | INTRAMUSCULAR | Status: AC | PRN
  Administered 2016-02-24: 1 mL via EPIDURAL

## 2016-02-24 MED ORDER — METHYLPREDNISOLONE ACETATE 40 MG/ML INJ SUSP (RADIOLOG
120.0000 mg | Freq: Once | INTRAMUSCULAR | Status: AC
  Administered 2016-02-24: 120 mg via EPIDURAL

## 2016-02-24 MED ORDER — METHYLPREDNISOLONE ACETATE 40 MG/ML INJ SUSP (RADIOLOG: 120.0000 mg | Freq: Once | INTRAMUSCULAR | Status: DC

## 2016-02-24 MED ORDER — IOHEXOL 180 MG/ML  SOLN
1.0000 mL | Freq: Once | INTRAMUSCULAR | Status: AC | PRN
Start: 2016-02-24 — End: 2016-02-24
  Administered 2016-02-24: 1 mL via INTRA_ARTICULAR

## 2016-04-27 IMAGING — CT CT ABD-PELV W/ CM
2 of 5 series · 17 of 46 positions shown, 19 images · IV contrast (Omnipaque 300)
Comparison: 02/14/2015

CLINICAL DATA: Left lower quadrant tenderness. Epigastric pain with
nausea and bloating. Symptoms for several months. Family history
pancreatic cancer. Hepatitis-C.

EXAM:
CT ABDOMEN AND PELVIS WITH CONTRAST
TECHNIQUE: Multidetector CT imaging of the abdomen and pelvis was performed
using the standard protocol following bolus administration of
intravenous contrast.
CONTRAST:  100mL OMNIPAQUE IOHEXOL 300 MG/ML  SOLN

[Series 2: abd/ pel 5mm · axial · 0.77mm/px · z∈[-476,-30]mm · 14 of 101 slices shown, 16 images]
[im 6/101  soft-tissue]
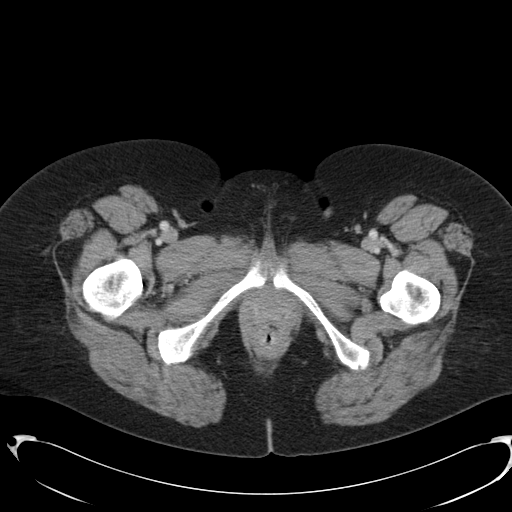
[im 6/101  bone]
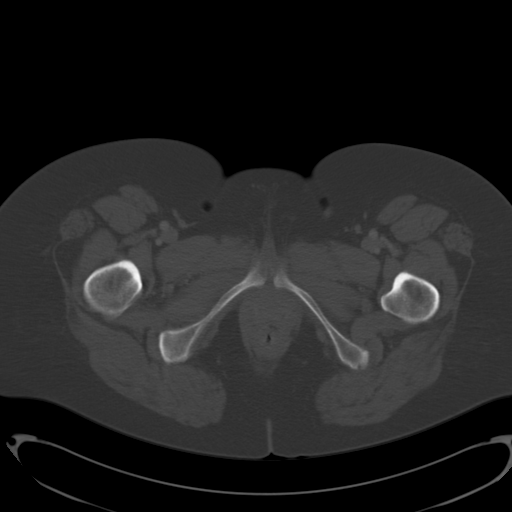
[im 11/101  soft-tissue]
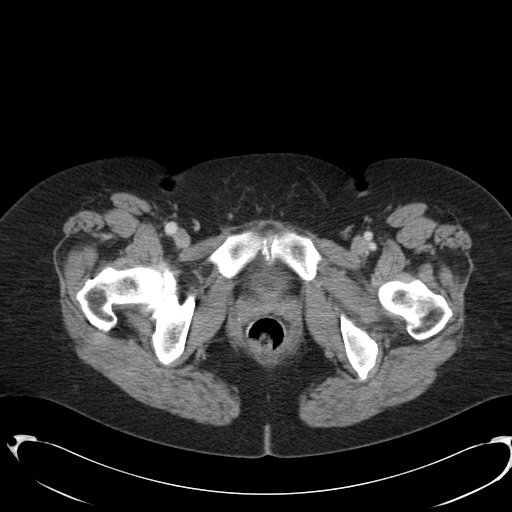
[im 22/101  soft-tissue]
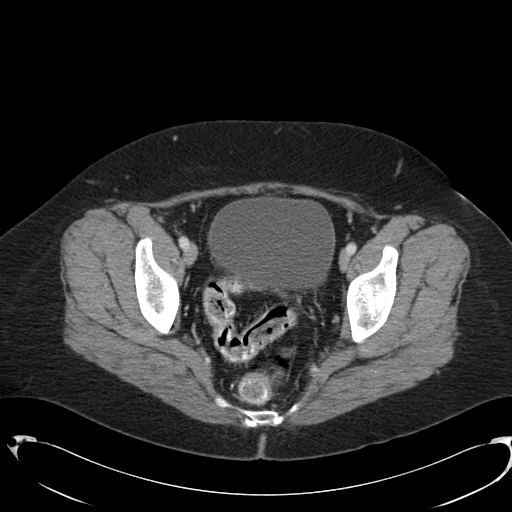
[im 27/101  soft-tissue]
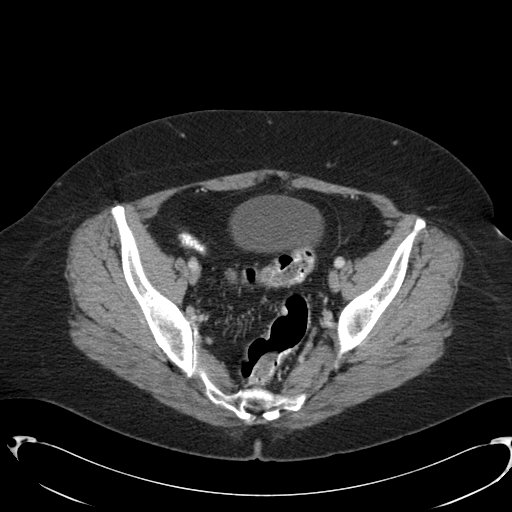
[im 32/101  soft-tissue]
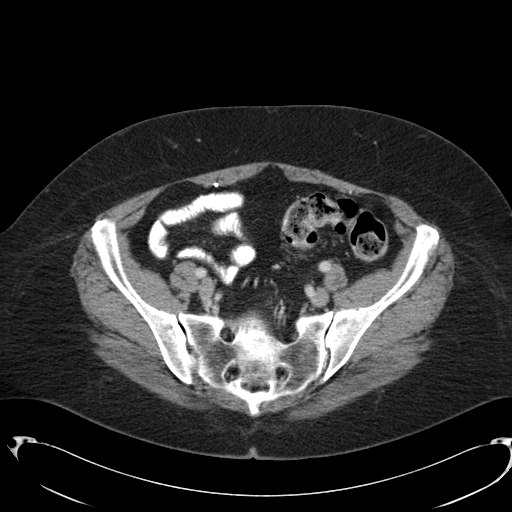
[im 43/101  soft-tissue]
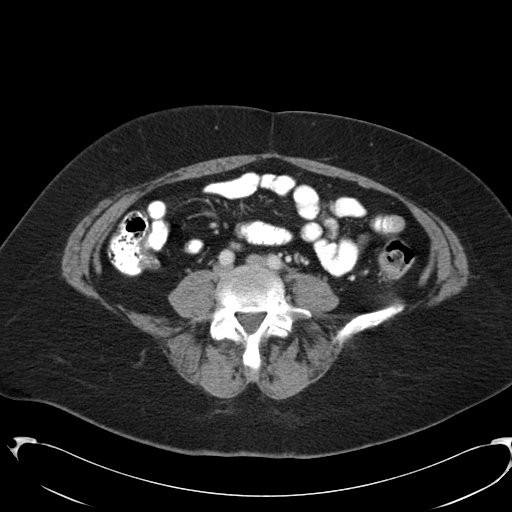
[im 48/101  soft-tissue]
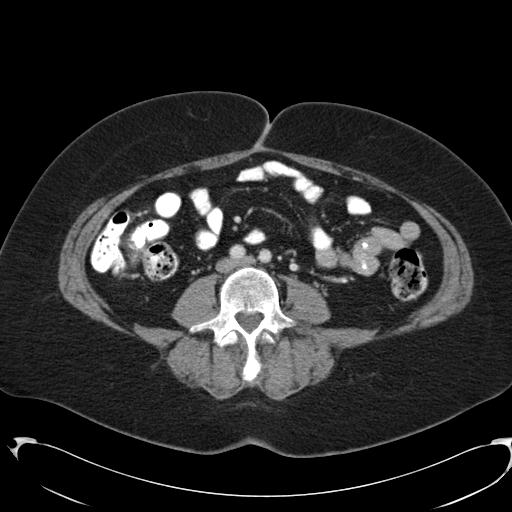
[im 53/101  soft-tissue]
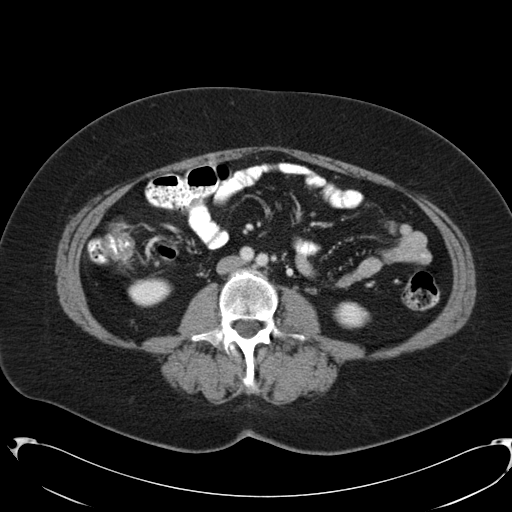
[im 58/101  soft-tissue]
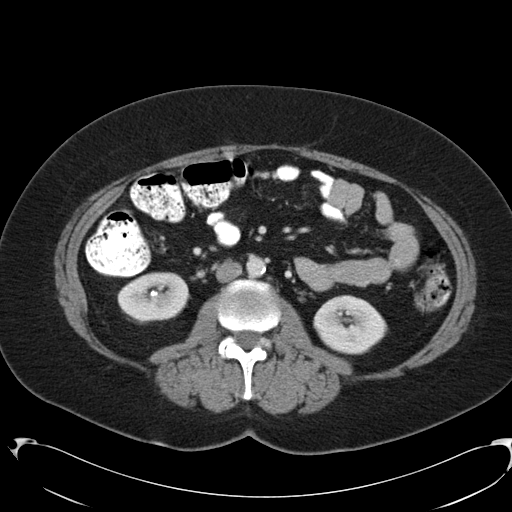
[im 58/101  bone]
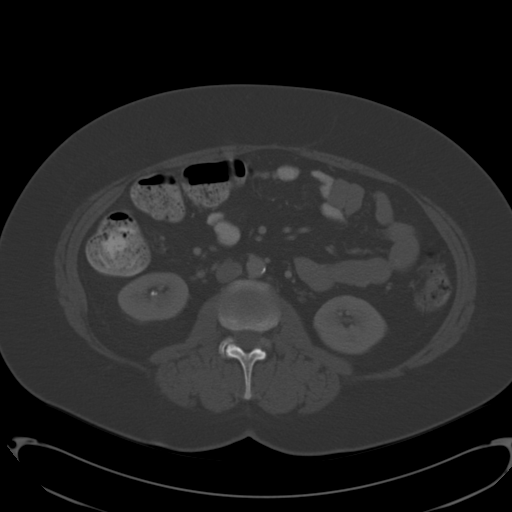
[im 69/101  soft-tissue]
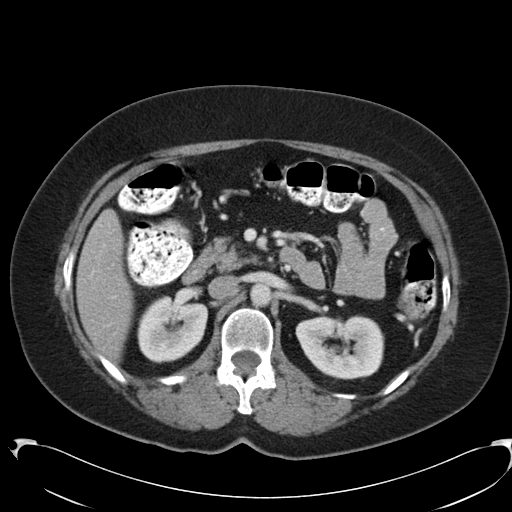
[im 74/101  soft-tissue]
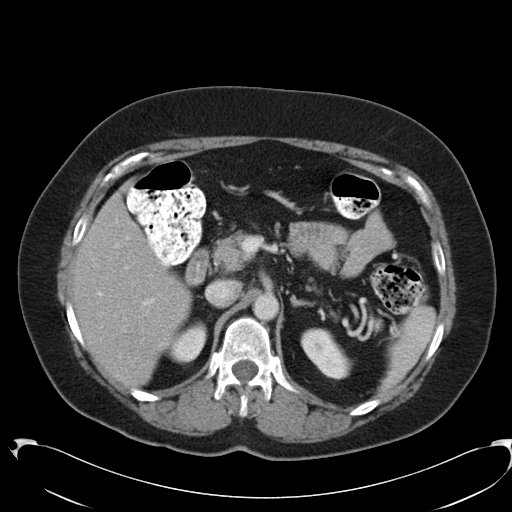
[im 79/101  soft-tissue]
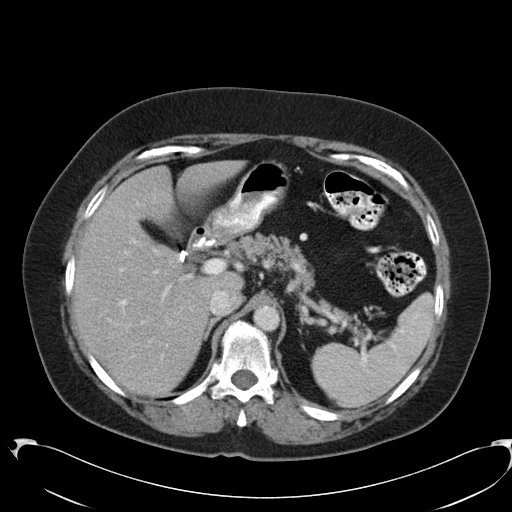
[im 90/101  soft-tissue]
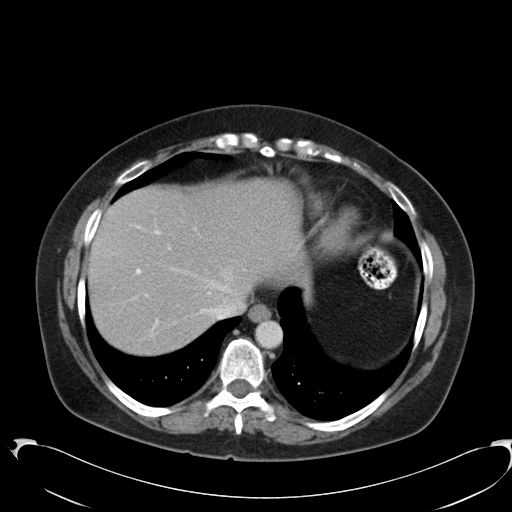
[im 95/101  soft-tissue]
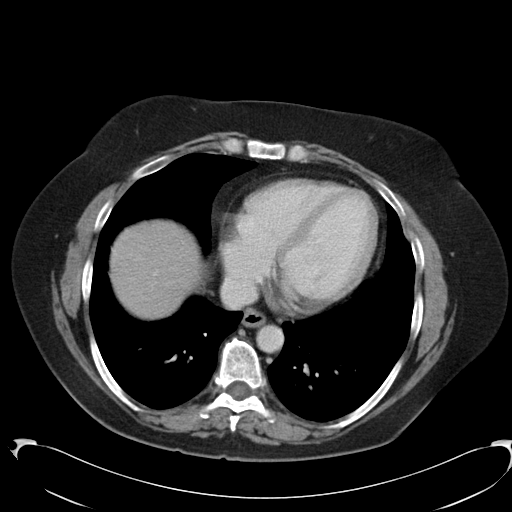

[Series 602: cor · coronal · 1.01mm/px · 3 of 127 slices shown]
[im 43/127  soft-tissue]
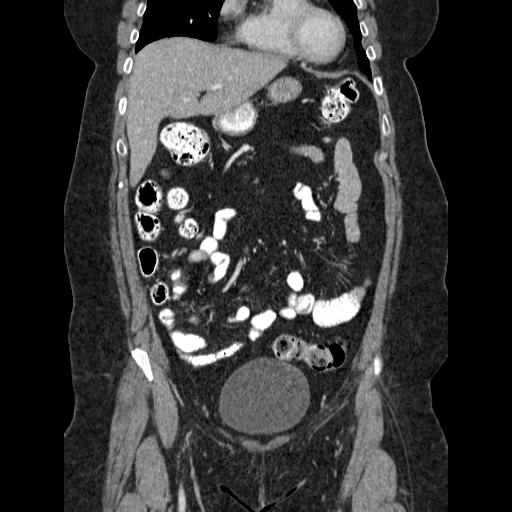
[im 57/127  soft-tissue]
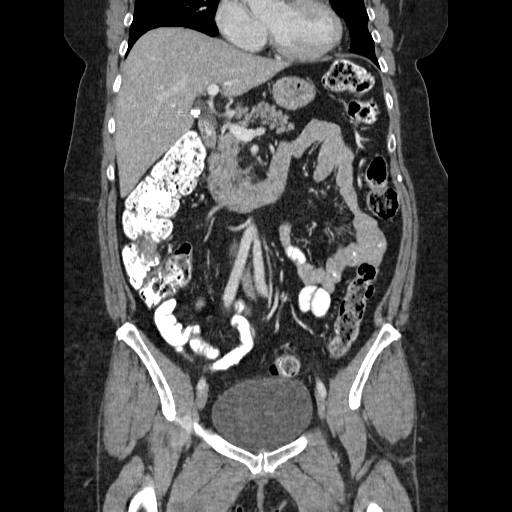
[im 71/127  soft-tissue]
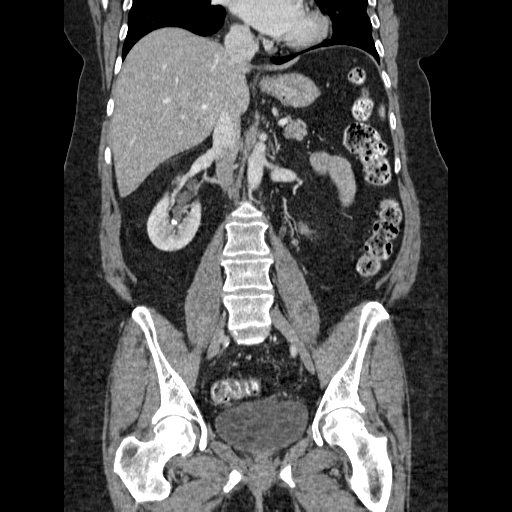

[17 of 46 positions shown; findings below may reference images not displayed]

FINDINGS: Lower chest: Clear lung bases. Normal heart size without pericardial
or pleural effusion.

Hepatobiliary: Mild hepatic steatosis, without focal liver lesion.
Cholecystectomy, without biliary ductal dilatation.

Pancreas: Normal, without mass or ductal dilatation.

Spleen: Normal

Adrenals/Urinary Tract: Normal adrenal glands. 5 mm lower pole right
renal collecting system calculus. Slightly malrotated right kidney.
Normal left kidney, without hydronephrosis or hydroureter. Normal
urinary bladder.

Stomach/Bowel: Normal stomach, without wall thickening. Normal colon
and terminal ileum. Appendix is not visualized but there is no
evidence of right lower quadrant inflammation. Normal small bowel.

Vascular/Lymphatic: Aortic and branch vessel atherosclerosis. No
abdominopelvic adenopathy.

Reproductive: Hysterectomy.  No adnexal mass.

Other: No significant free fluid.

Musculoskeletal: No acute osseous abnormality.
IMPRESSION: 1.  No acute process or explanation for left lower quadrant pain.
2. Right nephrolithiasis.
3. Mild hepatic steatosis.

## 2016-05-14 ENCOUNTER — Other Ambulatory Visit: Payer: Self-pay | Admitting: Cardiovascular Disease

## 2016-05-14 ENCOUNTER — Other Ambulatory Visit: Payer: Self-pay | Admitting: Endocrinology

## 2016-05-21 ENCOUNTER — Other Ambulatory Visit (HOSPITAL_COMMUNITY): Payer: Self-pay | Admitting: Rheumatology

## 2016-05-21 DIAGNOSIS — M064 Inflammatory polyarthropathy: Secondary | ICD-10-CM

## 2016-05-25 ENCOUNTER — Ambulatory Visit (INDEPENDENT_AMBULATORY_CARE_PROVIDER_SITE_OTHER): Payer: 59 | Admitting: Internal Medicine

## 2016-05-25 DIAGNOSIS — Z23 Encounter for immunization: Secondary | ICD-10-CM

## 2016-05-25 DIAGNOSIS — B182 Chronic viral hepatitis C: Secondary | ICD-10-CM | POA: Diagnosis not present

## 2016-05-28 ENCOUNTER — Encounter (HOSPITAL_COMMUNITY)
Admission: RE | Admit: 2016-05-28 | Discharge: 2016-05-28 | Disposition: A | Discharge status: Home / Self Care | Payer: 59 | Source: Ambulatory Visit | Attending: Rheumatology | Admitting: Rheumatology

## 2016-05-28 ENCOUNTER — Ambulatory Visit (HOSPITAL_COMMUNITY)
Admission: RE | Admit: 2016-05-28 | Discharge: 2016-05-28 | Disposition: A | Discharge status: Home / Self Care | Payer: 59 | Source: Ambulatory Visit | Attending: Rheumatology | Admitting: Rheumatology

## 2016-05-28 DIAGNOSIS — M064 Inflammatory polyarthropathy: Secondary | ICD-10-CM | POA: Diagnosis not present

## 2016-05-28 MED ORDER — TECHNETIUM TC 99M MEDRONATE IV KIT
27.5000 | PACK | Freq: Once | INTRAVENOUS | Status: AC | PRN
  Administered 2016-05-28: 27.5 via INTRAVENOUS

## 2016-08-21 ENCOUNTER — Other Ambulatory Visit: Payer: Self-pay | Admitting: Endocrinology

## 2016-08-21 NOTE — Telephone Encounter (Signed)
Please refill x 1 Ov is due  

## 2018-05-31 ENCOUNTER — Encounter: Payer: Self-pay | Admitting: Cardiovascular Disease

## 2018-06-01 ENCOUNTER — Encounter: Payer: Self-pay | Admitting: *Deleted

## 2018-06-02 ENCOUNTER — Ambulatory Visit (INDEPENDENT_AMBULATORY_CARE_PROVIDER_SITE_OTHER): Payer: BLUE CROSS/BLUE SHIELD | Admitting: Cardiovascular Disease

## 2018-06-02 ENCOUNTER — Encounter: Payer: Self-pay | Admitting: Cardiovascular Disease

## 2018-06-02 VITALS — BP 164/110 | HR 82 | Ht 66.0 in | Wt 213.0 lb

## 2018-06-02 DIAGNOSIS — I471 Supraventricular tachycardia, unspecified: Secondary | ICD-10-CM

## 2018-06-02 DIAGNOSIS — I10 Essential (primary) hypertension: Secondary | ICD-10-CM | POA: Diagnosis not present

## 2018-06-02 DIAGNOSIS — R600 Localized edema: Secondary | ICD-10-CM

## 2018-06-02 DIAGNOSIS — R079 Chest pain, unspecified: Secondary | ICD-10-CM

## 2018-06-02 DIAGNOSIS — I25118 Atherosclerotic heart disease of native coronary artery with other forms of angina pectoris: Secondary | ICD-10-CM | POA: Diagnosis not present

## 2018-06-02 DIAGNOSIS — E876 Hypokalemia: Secondary | ICD-10-CM | POA: Diagnosis not present

## 2018-06-02 DIAGNOSIS — Z79899 Other long term (current) drug therapy: Secondary | ICD-10-CM | POA: Diagnosis not present

## 2018-06-02 DIAGNOSIS — Z9289 Personal history of other medical treatment: Secondary | ICD-10-CM

## 2018-06-02 DIAGNOSIS — R0602 Shortness of breath: Secondary | ICD-10-CM | POA: Diagnosis not present

## 2018-06-02 MED ORDER — AZILSARTAN MEDOXOMIL 40 MG PO TABS: 40.0000 mg | ORAL_TABLET | Freq: Every day | ORAL | 11 refills | Status: DC

## 2018-06-02 NOTE — Progress Notes (Signed)
SUBJECTIVE: The patient presents for posthospitalization follow-up.  I last saw her in December 2016.  She has a history of PSVT, nonobstructive coronary artery disease, and hypertension. Coronary angiography on 10/07/2011 showed a 60% ostial lesion and a small diagonal branch less than 2 mm diameter.  No angioplasty was recommended at that time.  She was evaluated in the ED at [facility] on 05/30/2018.  I reviewed extensive documentation, labs, and studies pertaining to this.  She had complained of sharp chest pains while driving to work on that morning.  She also complained of right-sided abdominal pain radiating to the chest whenever she bent over.  I reviewed a chest x-ray which showed no active cardia pulmonary disease.  I also reviewed a CT of the chest abdomen and pelvis which showed a 4 cm ascending thoracic aortic aneurysm with fatty infiltration of the liver and nonobstructive right renal calculus.  Aortic atherosclerosis was also noted.  Relevant labs: Sodium 136, potassium low at 3.4, BUN 12, creatinine 0.55, magnesium 1.9, normal troponin, d-dimer was normal at 0.32, white blood cells 10.1, Hgb 15.6, platelets 288.  I personally reviewed the ECG which demonstrated normal sinus rhythm with no ischemic ST segment or T wave abnormalities, nor any arrhythmias.  She said she has not been feeling well for the past 2 or 3 months.  She underwent cataract surgery on 04/03/2018 and then developed tachycardia with heart rates as high as 200 bpm on 4/23.  She said she took an extra dose of either diltiazem or metoprolol and her heart rate came down to 100 bpm.  She has been having back pain radiating to the center of her chest.  She has tried Tums and Protonix without relief.  Her blood pressures have been elevated at home as well.  She has had intermittent bilateral leg and feet swelling and takes Lasix with potassium intermittently for this.   Review of Systems: As per  "subjective", otherwise negative.  Allergies  Allergen Reactions  . Peanut-Containing Drug Products Hives and Swelling    Stayed in hospital for three days; "swelled from inside out"  . Hydrocodone Itching    Itching occurs with benadryl  . Aspirin Hives  . Ciprofloxacin Itching and Rash  . Ibuprofen Hives    Current Outpatient Medications  Medication Sig Dispense Refill  . albuterol (PROVENTIL) (2.5 MG/3ML) 0.083% nebulizer solution Take 2.5 mg by nebulization every 6 (six) hours as needed for wheezing.    . diltiazem (TIAZAC) 180 MG 24 hr capsule Take 180 mg by mouth 2 (two) times daily.  1  . furosemide (LASIX) 40 MG tablet Take 40 mg by mouth daily as needed.    . metoprolol succinate (TOPROL-XL) 25 MG 24 hr tablet Take 25 mg by mouth daily.  4  . ondansetron (ZOFRAN ODT) 4 MG disintegrating tablet Take 1 tablet (4 mg total) by mouth every 8 (eight) hours as needed for nausea or vomiting. 20 tablet 0  . pantoprazole (PROTONIX) 40 MG tablet Take 1 tablet (40 mg total) by mouth 2 (two) times daily. OFFICE VISIT FOR FUTURE REFILLS 180 tablet 0  . potassium chloride SA (K-DUR,KLOR-CON) 20 MEQ tablet Take 20 mEq by mouth daily as needed.     No current facility-administered medications for this visit.     Past Medical History:  Diagnosis Date  . CAD (coronary artery disease)    Nonobstructive, 09/2011 (false positive GXT)  . Cardiomegaly   . Chest pain, unspecified 2008  Cardiac Catheterization   . Chronic hepatitis C without mention of hepatic coma   . Degenerative disk disease   . First degree atrioventricular block   . HTN (hypertension)   . Obesity, unspecified   . Other and unspecified hyperlipidemia   . Other premature beats   . Palpitations   . Polycythemia vera(238.4)   . Postinflammatory pulmonary fibrosis ([place])   . PSVT (paroxysmal supraventricular tachycardia) ([place])   . Pure hypercholesterolemia   . Unspecified asthma(493.90)   . Unspecified asthma(493.90)     . Unspecified essential hypertension     Past Surgical History:  Procedure Laterality Date  . ABDOMINAL HYSTERECTOMY     Bilateral salpingo-oophorectomy  . CARDIAC CATHETERIZATION  2008   mild CAD. 50% diagonal stenosis.   . CHOLECYSTECTOMY     post  . SINUS SURGERY WITH INSTATRAK  1995    Social History   Socioeconomic History  . Marital status: Married    Spouse name: STEVEN   . Number of children: 4  . Years of education: Not on file  . Highest education level: Not on file  Occupational History  . Occupation: SELF EMPLOYED  Social Needs  . Financial resource strain: Not on file  . Food insecurity:    Worry: Not on file    Inability: Not on file  . Transportation needs:    Medical: Not on file    Non-medical: Not on file  Tobacco Use  . Smoking status: Never Smoker  . Smokeless tobacco: Never Used  Substance and Sexual Activity  . Alcohol use: No    Alcohol/week: 0.0 oz  . Drug use: No  . Sexual activity: Yes    Partners: Male  Lifestyle  . Physical activity:    Days per week: Not on file    Minutes per session: Not on file  . Stress: Not on file  Relationships  . Social connections:    Talks on phone: Not on file    Gets together: Not on file    Attends religious service: Not on file    Active member of club or organization: Not on file    Attends meetings of clubs or organizations: Not on file    Relationship status: Not on file  . Intimate partner violence:    Fear of current or ex partner: Not on file    Emotionally abused: Not on file    Physically abused: Not on file    Forced sexual activity: Not on file  Other Topics Concern  . Not on file  Social History Narrative   She lives with husband, daughter, mother, and mother-in-law.   She does alterations (own business).   Highest level of education:  High school           Vitals:   06/02/18 0910  BP: (!) 164/110  Pulse: 82  SpO2: 98%  Weight: 213 lb (96.6 kg)  Height: 5\' 6"  (1.676 m)     Wt Readings from Last 3 Encounters:  06/02/18 213 lb (96.6 kg)  11/14/15 214 lb (97.1 kg)  05/24/15 207 lb (93.9 kg)     PHYSICAL EXAM General: NAD HEENT: Normal. Neck: No JVD, no thyromegaly. Lungs: Clear to auscultation bilaterally with normal respiratory effort. CV: Regular rate and rhythm, normal S1/S2, no S3/S4, no murmur. No pretibial or periankle edema.  No carotid bruit.   Abdomen: Soft, mild epigastric tenderness, no distention.  Neurologic: Alert and oriented.  Psych: Normal affect. Skin: Normal. Musculoskeletal: No gross deformities.  ECG: Most recent ECG reviewed.   Labs: Lab Results  Component Value Date/Time   K 4.2 04/24/2015 09:37 AM   BUN 14 04/24/2015 09:37 AM   CREATININE 0.64 04/24/2015 09:37 AM   ALT 28 05/24/2015 10:35 AM   TSH 0.58 04/24/2015 03:27 PM   HGB 16.6 (H) 04/24/2015 09:37 AM     Lipids: No results found for: LDLCALC, LDLDIRECT, CHOL, TRIG, HDL     ASSESSMENT AND PLAN: 1.  Paroxysmal supraventricular tachycardia: Symptomatically stable on long-acting diltiazem 180 mg twice daily and Toprol-XL 25 mg daily.  2.  Hypertension: Blood pressure is significantly elevated.  I will start Edarbi 40 mg daily.  3.  Coronary disease: Coronary angiogram from 2012 reviewed above.  Continue medical therapy with Toprol-XL.  I will aim to control blood pressure with the addition of Edarbi.  4.  Ascending thoracic aortic aneurysm: 4 cm in diameter as noted above.  I will aim to control blood pressure with the addition of Edarbi 40 mg daily.  She is already on a beta-blocker.  I will monitor with surveillance imaging.  5.  Chest pain, shortness of breath, and bilateral leg edema: Symptoms may be related to severely elevated blood pressures.  I will obtain an echocardiogram to evaluate cardiac structure and function.  6.  Hypokalemia: She has diffuse chest wall tenderness.  This may be related to hypokalemia.  I will increase potassium chloride 40  mEq daily for 3 days and check a basic metabolic panel within the next several days.    Disposition: Follow up 6-8 weeks.  Time spent: 40 minutes, of which greater than 50% was spent reviewing symptoms, relevant blood tests and studies, and discussing management plan with the patient.    Kate Sable, M.D., F.A.C.C.

## 2018-06-02 NOTE — Patient Instructions (Addendum)
Your physician wants you to follow-up in: 6-8 weeks  with Dr.Koneswaran    INCREASE Potassium to 40 meq daily for 3 days and then go back to using as needed   Get lab work : BMET on Monday  6/24    START Edarbi 40 mg daily at breakfast   Samples today 3 bottles # 21 tablets   Lot [id number], exp  03/2020     Your physician has requested that you have an echocardiogram. Echocardiography is a painless test that uses sound waves to create images of your heart. It provides your doctor with information about the size and shape of your heart and how well your heart's chambers and valves are working. This procedure takes approximately one hour. There are no restrictions for this procedure.

## 2018-06-03 ENCOUNTER — Ambulatory Visit: Payer: 59 | Admitting: Cardiovascular Disease

## 2018-06-03 ENCOUNTER — Ambulatory Visit (HOSPITAL_COMMUNITY)
Admission: RE | Admit: 2018-06-03 | Discharge: 2018-06-03 | Disposition: A | Discharge status: Home / Self Care | Payer: BLUE CROSS/BLUE SHIELD | Source: Ambulatory Visit | Attending: Cardiovascular Disease | Admitting: Cardiovascular Disease

## 2018-06-03 DIAGNOSIS — I351 Nonrheumatic aortic (valve) insufficiency: Secondary | ICD-10-CM | POA: Insufficient documentation

## 2018-06-03 DIAGNOSIS — I2510 Atherosclerotic heart disease of native coronary artery without angina pectoris: Secondary | ICD-10-CM | POA: Diagnosis not present

## 2018-06-03 DIAGNOSIS — R079 Chest pain, unspecified: Secondary | ICD-10-CM | POA: Diagnosis not present

## 2018-06-03 DIAGNOSIS — I471 Supraventricular tachycardia: Secondary | ICD-10-CM | POA: Diagnosis not present

## 2018-06-03 NOTE — Progress Notes (Signed)
*  PRELIMINARY RESULTS* Echocardiogram 2D Echocardiogram has been performed.  Leavy Cella 06/03/2018, 11:12 AM

## 2018-06-06 ENCOUNTER — Telehealth: Payer: Self-pay | Admitting: *Deleted

## 2018-06-06 NOTE — Telephone Encounter (Signed)
-----   Message from Herminio Commons, MD sent at 06/03/2018  5:13 PM EDT ----- Normal pumping function. Mild valve leakage.

## 2018-06-06 NOTE — Telephone Encounter (Signed)
Patient informed. 

## 2018-06-08 ENCOUNTER — Encounter: Payer: Self-pay | Admitting: Cardiovascular Disease

## 2018-07-21 ENCOUNTER — Ambulatory Visit (INDEPENDENT_AMBULATORY_CARE_PROVIDER_SITE_OTHER): Payer: BLUE CROSS/BLUE SHIELD | Admitting: Student

## 2018-07-21 ENCOUNTER — Ambulatory Visit: Payer: BLUE CROSS/BLUE SHIELD | Admitting: Cardiovascular Disease

## 2018-07-21 ENCOUNTER — Encounter: Payer: Self-pay | Admitting: Student

## 2018-07-21 VITALS — BP 138/82 | HR 82 | Ht 66.0 in | Wt 216.0 lb

## 2018-07-21 DIAGNOSIS — I712 Thoracic aortic aneurysm, without rupture, unspecified: Secondary | ICD-10-CM

## 2018-07-21 DIAGNOSIS — I471 Supraventricular tachycardia, unspecified: Secondary | ICD-10-CM

## 2018-07-21 DIAGNOSIS — I10 Essential (primary) hypertension: Secondary | ICD-10-CM | POA: Diagnosis not present

## 2018-07-21 DIAGNOSIS — I25118 Atherosclerotic heart disease of native coronary artery with other forms of angina pectoris: Secondary | ICD-10-CM

## 2018-07-21 MED ORDER — METOPROLOL SUCCINATE ER 50 MG PO TB24: 50.0000 mg | ORAL_TABLET | Freq: Every day | ORAL | 11 refills | Status: DC

## 2018-07-21 NOTE — Patient Instructions (Signed)
Medication Instructions:  Your physician has recommended you make the following change in your medication:  Increase Toprol XL to 50 mg Daily    Labwork: NONE   Testing/Procedures: NONE   Follow-Up: Your physician wants you to follow-up in: 1 Year. You will receive a reminder letter in the mail two months in advance. If you don't receive a letter, please call our office to schedule the follow-up appointment.   Any Other Special Instructions Will Be Listed Below (If Applicable).     If you need a refill on your cardiac medications before your next appointment, please call your pharmacy.

## 2018-07-21 NOTE — Progress Notes (Signed)
Cardiology Office Note    Date:  07/21/2018   ID:  TALIN ROZEBOOM, DOB 05/05/1961, MRN [id number]  PCP:  Glenda Chroman, MD  Cardiologist: Kate Sable, MD    Chief Complaint  Patient presents with  . Follow-up    2 month visit    History of Present Illness:    Connie Ho is a 57 y.o. female with past medical history of CAD (nonobstructive disease by cardiac catheterization in 2012), pSVT, HTN, HLD, ascending thoracic aortic aneurysm, and asthma who presents to the office today for 82-month follow-up.  She was last examined by Dr. Bronson Ing in 05/2018 following a recent hospitalization at [facility] for chest discomfort which have been sharp and worse when bending over. Labs showed no acute findings and her EKG showed no acute ischemic changes. Reported having episodes of back pain radiating to the center of her chest and had been trying OTC antiacids without any improvement. Her symptoms were thought to be related to possible elevated blood pressure as this was at 164/110 during her visit. Edarbi 40 mg daily was added to her medication regimen.  In talking with the patient today, she reports having episodes of frequent palpitations occurring last week. At that time, blood pressure was very low with SBP in the 90's to low 100's and she had stopped taking Toprol-XL given the recent addition of Edarbi. She ran out of samples of Edarbi and has not obtained a refill of this since. In the past several days, she has still continued to have palpitations which can last for 5 to 10 minutes. Denies any associated lightheadedness, dizziness, chest pain, dyspnea, or presyncope. She does experience frequent headaches which is now new for her.   Past Medical History:  Diagnosis Date  . CAD (coronary artery disease)    Nonobstructive, 09/2011 (false positive GXT)  . Cardiomegaly   . Chest pain, unspecified 2008   Cardiac Catheterization   . Chronic hepatitis C without mention of  hepatic coma   . Degenerative disk disease   . First degree atrioventricular block   . HTN (hypertension)   . Obesity, unspecified   . Other and unspecified hyperlipidemia   . Other premature beats   . Palpitations   . Polycythemia vera(238.4)   . Postinflammatory pulmonary fibrosis ([place])   . PSVT (paroxysmal supraventricular tachycardia) ([place])   . Pure hypercholesterolemia   . Unspecified asthma(493.90)   . Unspecified asthma(493.90)   . Unspecified essential hypertension     Past Surgical History:  Procedure Laterality Date  . ABDOMINAL HYSTERECTOMY     Bilateral salpingo-oophorectomy  . CARDIAC CATHETERIZATION  2008   mild CAD. 50% diagonal stenosis.   . CHOLECYSTECTOMY     post  . SINUS SURGERY WITH INSTATRAK  1995    Current Medications: Outpatient Medications Prior to Visit  Medication Sig Dispense Refill  . albuterol (PROVENTIL) (2.5 MG/3ML) 0.083% nebulizer solution Take 2.5 mg by nebulization every 6 (six) hours as needed for wheezing.    . diltiazem (TIAZAC) 180 MG 24 hr capsule Take 180 mg by mouth 2 (two) times daily.  1  . furosemide (LASIX) 40 MG tablet Take 40 mg by mouth daily as needed.    . ondansetron (ZOFRAN ODT) 4 MG disintegrating tablet Take 1 tablet (4 mg total) by mouth every 8 (eight) hours as needed for nausea or vomiting. 20 tablet 0  . pantoprazole (PROTONIX) 40 MG tablet Take 1 tablet (40 mg total) by mouth 2 (two) times  daily. OFFICE VISIT FOR FUTURE REFILLS 180 tablet 0  . potassium chloride SA (K-DUR,KLOR-CON) 20 MEQ tablet Take 20 mEq by mouth daily as needed.    . Azilsartan Medoxomil (EDARBI) 40 MG TABS Take 40 mg by mouth daily. 30 tablet 11  . metoprolol succinate (TOPROL-XL) 25 MG 24 hr tablet Take 25 mg by mouth daily.  4   No facility-administered medications prior to visit.      Allergies:   Peanut-containing drug products; Hydrocodone; Aspirin; Ciprofloxacin; and Ibuprofen   Social History   Socioeconomic History  . Marital  status: Married    Spouse name: STEVEN   . Number of children: 4  . Years of education: Not on file  . Highest education level: Not on file  Occupational History  . Occupation: SELF EMPLOYED  Social Needs  . Financial resource strain: Not on file  . Food insecurity:    Worry: Not on file    Inability: Not on file  . Transportation needs:    Medical: Not on file    Non-medical: Not on file  Tobacco Use  . Smoking status: Never Smoker  . Smokeless tobacco: Never Used  Substance and Sexual Activity  . Alcohol use: No    Alcohol/week: 0.0 standard drinks  . Drug use: No  . Sexual activity: Yes    Partners: Male  Lifestyle  . Physical activity:    Days per week: Not on file    Minutes per session: Not on file  . Stress: Not on file  Relationships  . Social connections:    Talks on phone: Not on file    Gets together: Not on file    Attends religious service: Not on file    Active member of club or organization: Not on file    Attends meetings of clubs or organizations: Not on file    Relationship status: Not on file  Other Topics Concern  . Not on file  Social History Narrative   She lives with husband, daughter, mother, and mother-in-law.   She does alterations (own business).   Highest level of education:  High school           Family History:  The patient's family history includes Atrial fibrillation in her mother; Coronary artery disease in her mother; Diabetes type II in her mother; Fibromyalgia in her sister and sister; Heart failure in her father; Hypertension in her mother; Leukemia in her brother; Stroke (age of onset: 43) in her father.   Review of Systems:   Please see the history of present illness.     General:  No chills, fever, night sweats or weight changes.  Cardiovascular:  No chest pain, dyspnea on exertion, edema, orthopnea, paroxysmal nocturnal dyspnea. Positive for palpitations.  Dermatological: No rash, lesions/masses Respiratory: No cough,  dyspnea Urologic: No hematuria, dysuria Abdominal:   No nausea, vomiting, diarrhea, bright red blood per rectum, melena, or hematemesis Neurologic:  No visual changes, wkns, changes in mental status. All other systems reviewed and are otherwise negative except as noted above.   Physical Exam:    VS:  BP 138/82   Pulse 82   Ht 5\' 6"  (1.676 m)   Wt 216 lb (98 kg)   SpO2 97%   BMI 34.86 kg/m    General: Well developed, well nourished Caucasian female appearing in no acute distress. Head: Normocephalic, atraumatic, sclera non-icteric, no xanthomas, nares are without discharge.  Neck: No carotid bruits. JVD not elevated.  Lungs: Respirations regular and  unlabored, without wheezes or rales.  Heart: Regular rate and rhythm. No S3 or S4.  No murmur, no rubs, or gallops appreciated. Abdomen: Soft, non-tender, non-distended with normoactive bowel sounds. No hepatomegaly. No rebound/guarding. No obvious abdominal masses. Msk:  Strength and tone appear normal for age. No joint deformities or effusions. Extremities: No clubbing or cyanosis. No lower extremity edema.  Distal pedal pulses are 2+ bilaterally. Neuro: Alert and oriented X 3. Moves all extremities spontaneously. No focal deficits noted. Psych:  Responds to questions appropriately with a normal affect. Skin: No rashes or lesions noted  Wt Readings from Last 3 Encounters:  07/21/18 216 lb (98 kg)  06/02/18 213 lb (96.6 kg)  11/14/15 214 lb (97.1 kg)     Studies/Labs Reviewed:   EKG:  EKG is not ordered today.   Recent Labs: No results found for requested labs within last 8760 hours.   Lipid Panel No results found for: CHOL, TRIG, HDL, CHOLHDL, VLDL, LDLCALC, LDLDIRECT  Additional studies/ records that were reviewed today include:   Echocardiogram: 06/03/2018 Study Conclusions  - Left ventricle: The cavity size was normal. Wall thickness was   increased in a pattern of mild LVH. Systolic function was normal.   The  estimated ejection fraction was in the range of 55% to 60%.   Wall motion was normal; there were no regional wall motion   abnormalities. Findings consistent with left ventricular   diastolic dysfunction, grade indeterminate. - Aortic valve: Trileaflet; mildly thickened leaflets. There was   mild regurgitation.   Assessment:    1. PSVT (paroxysmal supraventricular tachycardia) ([place])   2. Coronary artery disease involving native coronary artery of native heart with other form of angina pectoris ([place])   3. Thoracic aortic aneurysm without rupture ([place])   4. Essential hypertension      Plan:   In order of problems listed above:  1. Paroxysmal SVT - she did develop worsening palpitations after having not taken Toprol-XL while on Edarbi due to episodes of hypotension. She still has palpitations occurring a few times per week and lasting for seconds at a time. Recent labs showed no electrolyte abnormalities. - she is currently on Diltiazem CD 180mg  BID and Toprol-XL 25mg  daily. Will plan to further titrate Toprol-XL to 50mg  daily.   2. CAD - nonobstructive CAD by cath in 2012. She denies any recurrent episodes of chest pain since her recent ED evaluation. Pain was thought to overall be atypical for a cardiac etiology at that time as it was worse with bending over and lasted for hours at a time. Enzymes were negative and EKG showed no acute ischemic changes.  - can consider a NST if she has recurrent chest pain but would not pursue at this time given no reccent symptoms.    3. Thoracic Aortic Aneurysm - at 4 cm by imaging in 05/2018. Will continue to follow on an annual basis.   4. HTN - BP is at 140/90 during today's visit. Reports episodes of hypotension when on Edarbi but has not taken this in over 6-7 days.  - continue Cardizem CD 180mg  BID and further titrate Toprol-XL to 50mg  daily. I have asked her to continue to follow BP in the ambulatory setting.    Medication Adjustments/Labs  and Tests Ordered: Current medicines are reviewed at length with the patient today.  Concerns regarding medicines are outlined above.  Medication changes, Labs and Tests ordered today are listed in the Patient Instructions below. Patient Instructions  Medication Instructions:  Your physician has recommended you make the following change in your medication:  Increase Toprol XL to 50 mg Daily   Labwork: NONE   Testing/Procedures: NONE   Follow-Up: Your physician wants you to follow-up in: 1 Year. You will receive a reminder letter in the mail two months in advance. If you don't receive a letter, please call our office to schedule the follow-up appointment.  Any Other Special Instructions Will Be Listed Below (If Applicable).  If you need a refill on your cardiac medications before your next appointment, please call your pharmacy.    Signed, Erma Heritage, PA-C  07/21/2018 10:27 PM    [place] [address] [place], [place] [address] Phone: [phone]

## 2018-09-26 ENCOUNTER — Encounter: Payer: Self-pay | Admitting: Internal Medicine

## 2019-01-17 ENCOUNTER — Other Ambulatory Visit: Payer: Self-pay

## 2019-01-17 ENCOUNTER — Emergency Department (HOSPITAL_COMMUNITY)
Admission: EM | Admit: 2019-01-17 | Discharge: 2019-01-17 | Disposition: A | Discharge status: Home / Self Care | Payer: BLUE CROSS/BLUE SHIELD | Attending: Emergency Medicine | Admitting: Emergency Medicine

## 2019-01-17 ENCOUNTER — Encounter (HOSPITAL_COMMUNITY): Payer: Self-pay | Admitting: Emergency Medicine

## 2019-01-17 DIAGNOSIS — Z5321 Procedure and treatment not carried out due to patient leaving prior to being seen by health care provider: Secondary | ICD-10-CM | POA: Diagnosis not present

## 2019-01-17 DIAGNOSIS — R109 Unspecified abdominal pain: Secondary | ICD-10-CM | POA: Diagnosis not present

## 2019-01-17 LAB — COMPREHENSIVE METABOLIC PANEL
ALT: 23 U/L (ref 0–44)
AST: 24 U/L (ref 15–41)
Albumin: 4.3 g/dL (ref 3.5–5.0)
Alkaline Phosphatase: 80 U/L (ref 38–126)
Anion gap: 10 (ref 5–15)
BILIRUBIN TOTAL: 0.6 mg/dL (ref 0.3–1.2)
BUN: 12 mg/dL (ref 6–20)
CO2: 24 mmol/L (ref 22–32)
Calcium: 9.6 mg/dL (ref 8.9–10.3)
Chloride: 103 mmol/L (ref 98–111)
Creatinine, Ser: 0.54 mg/dL (ref 0.44–1.00)
Glucose, Bld: 104 mg/dL — ABNORMAL HIGH (ref 70–99)
POTASSIUM: 3.5 mmol/L (ref 3.5–5.1)
Sodium: 137 mmol/L (ref 135–145)
Total Protein: 8.2 g/dL — ABNORMAL HIGH (ref 6.5–8.1)

## 2019-01-17 LAB — CBC
HCT: 46.6 % — ABNORMAL HIGH (ref 36.0–46.0)
Hemoglobin: 15.8 g/dL — ABNORMAL HIGH (ref 12.0–15.0)
MCH: 29.8 pg (ref 26.0–34.0)
MCHC: 33.9 g/dL (ref 30.0–36.0)
MCV: 87.9 fL (ref 80.0–100.0)
NRBC: 0 % (ref 0.0–0.2)
PLATELETS: 286 10*3/uL (ref 150–400)
RBC: 5.3 MIL/uL — AB (ref 3.87–5.11)
RDW: 12.2 % (ref 11.5–15.5)
WBC: 13.3 10*3/uL — AB (ref 4.0–10.5)

## 2019-01-17 LAB — URINALYSIS, ROUTINE W REFLEX MICROSCOPIC
BILIRUBIN URINE: NEGATIVE
Bacteria, UA: NONE SEEN
GLUCOSE, UA: NEGATIVE mg/dL
HGB URINE DIPSTICK: NEGATIVE
KETONES UR: NEGATIVE mg/dL
NITRITE: NEGATIVE
Protein, ur: NEGATIVE mg/dL
Specific Gravity, Urine: 1.003 — ABNORMAL LOW (ref 1.005–1.030)
pH: 8 (ref 5.0–8.0)

## 2019-01-17 LAB — LIPASE, BLOOD: Lipase: 27 U/L (ref 11–51)

## 2019-01-17 NOTE — ED Triage Notes (Signed)
Patient complaining of upper right quadrant abdominal pain x 2-3 days. Denies vomiting or diarrhea.

## 2019-04-20 ENCOUNTER — Telehealth: Payer: Self-pay | Admitting: Cardiovascular Disease

## 2019-04-20 ENCOUNTER — Encounter: Payer: Self-pay | Admitting: Cardiovascular Disease

## 2019-04-20 ENCOUNTER — Telehealth (INDEPENDENT_AMBULATORY_CARE_PROVIDER_SITE_OTHER): Payer: BLUE CROSS/BLUE SHIELD | Admitting: Cardiovascular Disease

## 2019-04-20 VITALS — BP 148/81 | HR 72 | Ht 66.0 in | Wt 202.0 lb

## 2019-04-20 DIAGNOSIS — R002 Palpitations: Secondary | ICD-10-CM

## 2019-04-20 DIAGNOSIS — I471 Supraventricular tachycardia: Secondary | ICD-10-CM

## 2019-04-20 DIAGNOSIS — E876 Hypokalemia: Secondary | ICD-10-CM

## 2019-04-20 DIAGNOSIS — R0609 Other forms of dyspnea: Secondary | ICD-10-CM

## 2019-04-20 DIAGNOSIS — R252 Cramp and spasm: Secondary | ICD-10-CM

## 2019-04-20 DIAGNOSIS — I712 Thoracic aortic aneurysm, without rupture, unspecified: Secondary | ICD-10-CM

## 2019-04-20 DIAGNOSIS — I25118 Atherosclerotic heart disease of native coronary artery with other forms of angina pectoris: Secondary | ICD-10-CM

## 2019-04-20 DIAGNOSIS — I10 Essential (primary) hypertension: Secondary | ICD-10-CM

## 2019-04-20 NOTE — Progress Notes (Signed)
Virtual Visit via Video Note   This visit type was conducted due to national recommendations for restrictions regarding the COVID-19 Pandemic (e.g. social distancing) in an effort to limit this patient's exposure and mitigate transmission in our community.  Due to her co-morbid illnesses, this patient is at least at moderate risk for complications without adequate follow up.  This format is felt to be most appropriate for this patient at this time. All issues noted in this document were discussed and addressed.    Please refer to the patient's chart for her  consent to telehealth for [facility].   Date:  04/20/2019   ID:  Connie Ho, DOB [birth year], MRN [id number]  Patient Location: Home Provider Location: Home  PCP:  Glenda Chroman, MD  Cardiologist:  Kate Sable, MD  Electrophysiologist:  None   Evaluation Performed:  Follow-Up Visit  Chief Complaint:  CAD  History of Present Illness:    Connie Ho is a 58 y.o. female with a history of PSVT, nonobstructive coronary artery disease, and hypertension. Coronary angiography on 10/07/2011 showed a 60% ostial lesion and a small diagonal branch less than 2 mm diameter.  No angioplasty was recommended at that time.  This past weekend, BP got low and she felt dizzy and felt like she might pass out. She then slept. BP was high. Today feels well. Sunday 188/140, hr 200 bpm, took extra diltiazem, then another tablet, then layed down. Avg HR 100-150 on Sunday. She denies chest pain. She does have exertional dyspnea.  She has bad calf and right foot cramps.  The patient does not have symptoms concerning for COVID-19 infection (fever, chills, cough, or new shortness of breath).    Past Medical History:  Diagnosis Date  . CAD (coronary artery disease)    Nonobstructive, 09/2011 (false positive GXT)  . Cardiomegaly   . Chest pain, unspecified 2008   Cardiac Catheterization   . Chronic hepatitis C without mention of  hepatic coma   . Degenerative disk disease   . First degree atrioventricular block   . HTN (hypertension)   . Obesity, unspecified   . Other and unspecified hyperlipidemia   . Other premature beats   . Palpitations   . Polycythemia vera(238.4)   . Postinflammatory pulmonary fibrosis ([place])   . PSVT (paroxysmal supraventricular tachycardia) ([place])   . Pure hypercholesterolemia   . Unspecified asthma(493.90)   . Unspecified asthma(493.90)   . Unspecified essential hypertension    Past Surgical History:  Procedure Laterality Date  . ABDOMINAL HYSTERECTOMY     Bilateral salpingo-oophorectomy  . CARDIAC CATHETERIZATION  2008   mild CAD. 50% diagonal stenosis.   . CHOLECYSTECTOMY     post  . SINUS SURGERY WITH INSTATRAK  1995     Current Meds  Medication Sig  . albuterol (PROVENTIL) (2.5 MG/3ML) 0.083% nebulizer solution Take 2.5 mg by nebulization every 6 (six) hours as needed for wheezing.  . diltiazem (TIAZAC) 180 MG 24 hr capsule Take 180 mg by mouth 2 (two) times daily.  . furosemide (LASIX) 40 MG tablet Take 40 mg by mouth daily as needed.  . metoprolol succinate (TOPROL-XL) 50 MG 24 hr tablet Take 1 tablet (50 mg total) by mouth daily.  . ondansetron (ZOFRAN ODT) 4 MG disintegrating tablet Take 1 tablet (4 mg total) by mouth every 8 (eight) hours as needed for nausea or vomiting.  . pantoprazole (PROTONIX) 40 MG tablet Take 1 tablet (40 mg total) by mouth 2 (two) times  daily. OFFICE VISIT FOR FUTURE REFILLS  . potassium chloride SA (K-DUR,KLOR-CON) 20 MEQ tablet Take 20 mEq by mouth daily as needed.     Allergies:   Peanut-containing drug products; Hydrocodone; Aspirin; Ciprofloxacin; and Ibuprofen   Social History   Tobacco Use  . Smoking status: Never Smoker  . Smokeless tobacco: Never Used  Substance Use Topics  . Alcohol use: No    Alcohol/week: 0.0 standard drinks  . Drug use: No     Family Hx: The patient's family history includes Atrial fibrillation in her  mother; Coronary artery disease in her mother; Diabetes type II in her mother; Fibromyalgia in her sister and sister; Heart failure in her father; Hypertension in her mother; Leukemia in her brother; Stroke (age of onset: 86) in her father.  ROS:   Please see the history of present illness.     All other systems reviewed and are negative.   Prior CV studies:   The following studies were reviewed today:  Coronary angiography on 10/07/2011 showed a 60% ostial lesion and a small diagonal branch less than 2 mm diameter.  Echo 06/03/18:  Study Conclusions  - Left ventricle: The cavity size was normal. Wall thickness was   increased in a pattern of mild LVH. Systolic function was normal.   The estimated ejection fraction was in the range of 55% to 60%.   Wall motion was normal; there were no regional wall motion   abnormalities. Findings consistent with left ventricular   diastolic dysfunction, grade indeterminate. - Aortic valve: Trileaflet; mildly thickened leaflets. There was   mild regurgitation.  Labs/Other Tests and Data Reviewed:    EKG:  No ECG reviewed.  Recent Labs: 01/17/2019: ALT 23; BUN 12; Creatinine, Ser 0.54; Hemoglobin 15.8; Platelets 286; Potassium 3.5; Sodium 137   Recent Lipid Panel No results found for: CHOL, TRIG, HDL, CHOLHDL, LDLCALC, LDLDIRECT  Wt Readings from Last 3 Encounters:  04/20/19 202 lb (91.6 kg)  07/21/18 216 lb (98 kg)  06/02/18 213 lb (96.6 kg)     Objective:    Vital Signs:  BP (!) 148/81   Pulse 72   Ht 5\' 6"  (1.676 m)   Wt 202 lb (91.6 kg)   BMI 32.60 kg/m    VITAL SIGNS:  reviewed GEN:  no acute distress EYES:  sclerae anicteric, EOMI - Extraocular Movements Intact RESPIRATORY:  normal respiratory effort, symmetric expansion CARDIOVASCULAR:  no peripheral edema MUSCULOSKELETAL:  no obvious deformities. NEURO:  alert and oriented x 3, no obvious focal deficit PSYCH:  normal affect  ASSESSMENT & PLAN:    1.  Paroxysmal  supraventricular tachycardia/palpitations: Currently on long-acting diltiazem 180 mg twice daily and Toprol-XL 50 mg daily. Has had elevated HR's recently. I will obtain a one week event monitor. If she is having frequent paroxysms of SVT, I will make an EP referral for ablation evaluation.  2.  Hypertension: BP mildly elevated. Will need continued monitoring.  3.  Coronary artery disease: Coronary angiogram from 2012 reviewed above.  She has had some exertional dyspnea. I would consider stress testing in the future. Continue medical therapy with Toprol-XL.   4.  Ascending thoracic aortic aneurysm: 4 cm in diameter as noted above. She is already on a beta-blocker.  I will monitor with surveillance imaging.  5. Hypokalemia/calf and feet cramps: I will check a BMET.    COVID-19 Education: The signs and symptoms of COVID-19 were discussed with the patient and how to seek care for testing (follow up  with PCP or arrange E-visit).  The importance of social distancing was discussed today.  Time:   Today, I have spent 40 minutes with the patient with telehealth technology discussing the above problems.     Medication Adjustments/Labs and Tests Ordered: Current medicines are reviewed at length with the patient today.  Concerns regarding medicines are outlined above.   Tests Ordered: No orders of the defined types were placed in this encounter.   Medication Changes: No orders of the defined types were placed in this encounter.   Disposition:  Follow up in 3 month(s)  Signed, Kate Sable, MD  04/20/2019 3:44 PM    [place]

## 2019-04-20 NOTE — Addendum Note (Signed)
Addended by: Laurine Blazer on: 04/20/2019 04:51 PM   Modules accepted: Orders

## 2019-04-20 NOTE — Telephone Encounter (Signed)
°  Precert needed for: 1 week event monitor - palps / PSVT - will probably be [employer] (?? pre-cert)    Location:     Date:

## 2019-04-20 NOTE — Patient Instructions (Addendum)
Medication Instructions:  Continue all current medications.  Labwork:   BMET, Magnesium - order enclosed.  Will do at [facility] across from [facility].    Testing/Procedures: Your physician has recommended that you wear a 1 week event monitor. Event monitors are medical devices that record the heart's electrical activity. Doctors most often [place] these monitors to diagnose arrhythmias. Arrhythmias are problems with the speed or rhythm of the heartbeat. The monitor is a small, portable device. You can wear one while you do your normal daily activities. This is usually used to diagnose what is causing palpitations/syncope (passing out).  Follow-Up:  Office will contact with results via phone or letter.    3 months  Any Other Special Instructions Will Be Listed Below (If Applicable).  If you need a refill on your cardiac medications before your next appointment, please call your pharmacy.

## 2019-04-21 ENCOUNTER — Telehealth: Payer: BLUE CROSS/BLUE SHIELD | Admitting: Cardiovascular Disease

## 2019-04-26 ENCOUNTER — Other Ambulatory Visit (HOSPITAL_COMMUNITY)
Admission: RE | Admit: 2019-04-26 | Discharge: 2019-04-26 | Disposition: A | Discharge status: Home / Self Care | Payer: BLUE CROSS/BLUE SHIELD | Source: Ambulatory Visit | Attending: Cardiovascular Disease | Admitting: Cardiovascular Disease

## 2019-04-26 ENCOUNTER — Telehealth: Payer: Self-pay | Admitting: Cardiovascular Disease

## 2019-04-26 DIAGNOSIS — I471 Supraventricular tachycardia: Secondary | ICD-10-CM | POA: Insufficient documentation

## 2019-04-26 DIAGNOSIS — I10 Essential (primary) hypertension: Secondary | ICD-10-CM | POA: Diagnosis present

## 2019-04-26 DIAGNOSIS — R252 Cramp and spasm: Secondary | ICD-10-CM | POA: Diagnosis present

## 2019-04-26 DIAGNOSIS — R002 Palpitations: Secondary | ICD-10-CM | POA: Diagnosis present

## 2019-04-26 LAB — BASIC METABOLIC PANEL
Anion gap: 12 (ref 5–15)
BUN: 8 mg/dL (ref 6–20)
CO2: 25 mmol/L (ref 22–32)
Calcium: 9.7 mg/dL (ref 8.9–10.3)
Chloride: 99 mmol/L (ref 98–111)
Creatinine, Ser: 0.56 mg/dL (ref 0.44–1.00)
GFR calc Af Amer: >60 mL/min (ref >60)
GFR calc non Af Amer: >60 mL/min (ref >60)
Glucose, Bld: 209 mg/dL — ABNORMAL HIGH (ref 70–99)
Potassium: 3.4 mmol/L — ABNORMAL LOW (ref 3.5–5.1)
Sodium: 136 mmol/L (ref 135–145)

## 2019-04-26 LAB — MAGNESIUM: Magnesium: 1.9 mg/dL (ref 1.7–2.4)

## 2019-04-26 NOTE — Telephone Encounter (Signed)
Pt has not received her monitor in the mail.

## 2019-04-26 NOTE — Telephone Encounter (Signed)
Pt aware that she should receive monitor no later than Monday

## 2019-04-27 ENCOUNTER — Telehealth: Payer: Self-pay | Admitting: *Deleted

## 2019-04-27 DIAGNOSIS — I10 Essential (primary) hypertension: Secondary | ICD-10-CM

## 2019-04-27 MED ORDER — MAGNESIUM 400 MG PO TABS: 400.0000 mg | ORAL_TABLET | Freq: Every day | ORAL | 0 refills | Status: DC

## 2019-04-27 NOTE — Telephone Encounter (Signed)
Pt voiced understanding - has 10 meq of potassium at home and will take 2 daily for 5 days. Asked that magnesium be sent to pharmacy. Lab orders placed pt will have them done in 2 weeks at [facility] - routed to pcp

## 2019-04-27 NOTE — Telephone Encounter (Signed)
-----   Message from Herminio Commons, MD sent at 04/26/2019  3:19 PM EDT ----- K mildly low which would explain calf and feet cramps. Have her take KCl 20 meq daily x 5 days. Remind her to take KCl whenever she takes Lasix as well. Mg low normal. Have her take 400 mg magnesium oxide daily x 5 days as well. Repeat BMET and Mg in 2 weeks.

## 2019-05-03 ENCOUNTER — Ambulatory Visit (INDEPENDENT_AMBULATORY_CARE_PROVIDER_SITE_OTHER): Payer: BLUE CROSS/BLUE SHIELD

## 2019-05-03 DIAGNOSIS — R002 Palpitations: Secondary | ICD-10-CM

## 2019-05-03 DIAGNOSIS — I471 Supraventricular tachycardia: Secondary | ICD-10-CM | POA: Diagnosis not present

## 2019-05-12 ENCOUNTER — Other Ambulatory Visit: Payer: Self-pay

## 2019-05-12 ENCOUNTER — Other Ambulatory Visit (HOSPITAL_COMMUNITY)
Admission: RE | Admit: 2019-05-12 | Discharge: 2019-05-12 | Disposition: A | Discharge status: Home / Self Care | Payer: BLUE CROSS/BLUE SHIELD | Source: Ambulatory Visit | Attending: Cardiovascular Disease | Admitting: Cardiovascular Disease

## 2019-05-12 ENCOUNTER — Telehealth: Payer: Self-pay | Admitting: *Deleted

## 2019-05-12 DIAGNOSIS — I10 Essential (primary) hypertension: Secondary | ICD-10-CM | POA: Insufficient documentation

## 2019-05-12 DIAGNOSIS — E876 Hypokalemia: Secondary | ICD-10-CM

## 2019-05-12 LAB — BASIC METABOLIC PANEL
Anion gap: 13 (ref 5–15)
BUN: 8 mg/dL (ref 6–20)
CO2: 26 mmol/L (ref 22–32)
Calcium: 9.9 mg/dL (ref 8.9–10.3)
Chloride: 100 mmol/L (ref 98–111)
Creatinine, Ser: 0.55 mg/dL (ref 0.44–1.00)
GFR calc Af Amer: >60 mL/min (ref >60)
GFR calc non Af Amer: >60 mL/min (ref >60)
Glucose, Bld: 133 mg/dL — ABNORMAL HIGH (ref 70–99)
Potassium: 3.4 mmol/L — ABNORMAL LOW (ref 3.5–5.1)
Sodium: 139 mmol/L (ref 135–145)

## 2019-05-12 LAB — MAGNESIUM: Magnesium: 1.8 mg/dL (ref 1.7–2.4)

## 2019-05-12 MED ORDER — MAGNESIUM 400 MG PO TABS: 400.0000 mg | ORAL_TABLET | Freq: Every day | ORAL | 3 refills | Status: DC

## 2019-05-12 MED ORDER — POTASSIUM CHLORIDE CRYS ER 20 MEQ PO TBCR: EXTENDED_RELEASE_TABLET | ORAL | 3 refills | Status: DC

## 2019-05-12 NOTE — Telephone Encounter (Signed)
Notes recorded by Laurine Blazer, LPN on 04/17/1363 at 3:83 PM EDT Patient notified. Copy to pmd. She will do her labs in 2 weeks at [facility]. Refills sent to pharmacy on her Potassium and Magnesium. ------  Notes recorded by Herminio Commons, MD on 05/12/2019 at 4:14 PM EDT K remains mildly low. Mg low normal. Have her take KCl 40 meq x 5 days, then 20 meq daily. Have her take 400 mg oxide daily. Repeat BMET and Mg in 2 weeks.

## 2019-05-24 ENCOUNTER — Other Ambulatory Visit: Payer: Self-pay

## 2019-05-24 ENCOUNTER — Telehealth: Payer: Self-pay | Admitting: Cardiovascular Disease

## 2019-05-24 NOTE — Telephone Encounter (Signed)
Patient called requesting to know results of recent heart monitor  Patient states that yesterday her heart started racing around 600pm. States while walking she became dizzy. States that she is very fatigue this morning.  [phone]) cell # if no answer please leave message. Patient is at work.

## 2019-05-24 NOTE — Telephone Encounter (Addendum)
As of 05/17/19 - no monitor results available yet on [employer] site.  Will check now.

## 2019-05-25 ENCOUNTER — Telehealth: Payer: Self-pay

## 2019-05-25 DIAGNOSIS — I471 Supraventricular tachycardia: Secondary | ICD-10-CM

## 2019-05-25 NOTE — Telephone Encounter (Signed)
Ref placed to EP, [id number]-cc

## 2019-05-25 NOTE — Telephone Encounter (Signed)
-----   Message from Herminio Commons, MD sent at 05/25/2019  4:06 PM EDT ----- She continues to have paroxysms of SVT. Please place EP referral to assess candidacy for ablation.

## 2019-05-26 ENCOUNTER — Other Ambulatory Visit: Payer: Self-pay

## 2019-05-26 ENCOUNTER — Other Ambulatory Visit (HOSPITAL_COMMUNITY)
Admission: RE | Admit: 2019-05-26 | Discharge: 2019-05-26 | Disposition: A | Discharge status: Home / Self Care | Payer: BLUE CROSS/BLUE SHIELD | Source: Ambulatory Visit | Attending: Cardiovascular Disease | Admitting: Cardiovascular Disease

## 2019-05-26 ENCOUNTER — Telehealth: Payer: Self-pay

## 2019-05-26 DIAGNOSIS — E876 Hypokalemia: Secondary | ICD-10-CM | POA: Diagnosis present

## 2019-05-26 LAB — BASIC METABOLIC PANEL
Anion gap: 13 (ref 5–15)
BUN: 8 mg/dL (ref 6–20)
CO2: 23 mmol/L (ref 22–32)
Calcium: 9.6 mg/dL (ref 8.9–10.3)
Chloride: 103 mmol/L (ref 98–111)
Creatinine, Ser: 0.57 mg/dL (ref 0.44–1.00)
GFR calc Af Amer: >60 mL/min (ref >60)
GFR calc non Af Amer: >60 mL/min (ref >60)
Glucose, Bld: 127 mg/dL — ABNORMAL HIGH (ref 70–99)
Potassium: 3.4 mmol/L — ABNORMAL LOW (ref 3.5–5.1)
Sodium: 139 mmol/L (ref 135–145)

## 2019-05-26 NOTE — Telephone Encounter (Signed)
[email or web address] 

## 2019-05-26 NOTE — Telephone Encounter (Signed)
-----   Message from Herminio Commons, MD sent at 05/26/2019  2:37 PM EDT ----- K still mildly low. Have her take KCl 40 meq bid x 5 days, then 40 meq daily. Repeat BMET in 2 weeks.

## 2019-05-31 NOTE — Telephone Encounter (Signed)
[email or web address] 

## 2019-06-01 MED ORDER — POTASSIUM CHLORIDE CRYS ER 20 MEQ PO TBCR: 40.0000 meq | EXTENDED_RELEASE_TABLET | Freq: Every day | ORAL | 3 refills | Status: DC

## 2019-06-01 NOTE — Telephone Encounter (Signed)
I spoke with patient today,she will increase potassium as directed and have repeat bmet in 2 weeks  Lab slip faxed to [facility] lab

## 2019-06-15 ENCOUNTER — Other Ambulatory Visit (HOSPITAL_COMMUNITY)
Admission: RE | Admit: 2019-06-15 | Discharge: 2019-06-15 | Disposition: A | Discharge status: Home / Self Care | Payer: BC Managed Care – PPO | Source: Ambulatory Visit | Attending: Cardiovascular Disease | Admitting: Cardiovascular Disease

## 2019-06-15 ENCOUNTER — Other Ambulatory Visit: Payer: Self-pay

## 2019-06-15 DIAGNOSIS — E876 Hypokalemia: Secondary | ICD-10-CM | POA: Insufficient documentation

## 2019-06-15 LAB — BASIC METABOLIC PANEL
Anion gap: 11 (ref 5–15)
BUN: 9 mg/dL (ref 6–20)
CO2: 25 mmol/L (ref 22–32)
Calcium: 9.8 mg/dL (ref 8.9–10.3)
Chloride: 102 mmol/L (ref 98–111)
Creatinine, Ser: 0.59 mg/dL (ref 0.44–1.00)
GFR calc Af Amer: >60 mL/min (ref >60)
GFR calc non Af Amer: >60 mL/min (ref >60)
Glucose, Bld: 263 mg/dL — ABNORMAL HIGH (ref 70–99)
Potassium: 3.7 mmol/L (ref 3.5–5.1)
Sodium: 138 mmol/L (ref 135–145)

## 2019-08-01 ENCOUNTER — Other Ambulatory Visit: Payer: Self-pay

## 2019-08-01 ENCOUNTER — Ambulatory Visit (INDEPENDENT_AMBULATORY_CARE_PROVIDER_SITE_OTHER): Payer: BC Managed Care – PPO | Admitting: Cardiovascular Disease

## 2019-08-01 ENCOUNTER — Encounter: Payer: Self-pay | Admitting: Cardiovascular Disease

## 2019-08-01 ENCOUNTER — Telehealth: Payer: Self-pay | Admitting: Cardiovascular Disease

## 2019-08-01 VITALS — BP 165/83 | HR 64 | Ht 66.0 in | Wt 209.2 lb

## 2019-08-01 DIAGNOSIS — R252 Cramp and spasm: Secondary | ICD-10-CM

## 2019-08-01 DIAGNOSIS — I712 Thoracic aortic aneurysm, without rupture, unspecified: Secondary | ICD-10-CM

## 2019-08-01 DIAGNOSIS — I10 Essential (primary) hypertension: Secondary | ICD-10-CM | POA: Diagnosis not present

## 2019-08-01 DIAGNOSIS — R002 Palpitations: Secondary | ICD-10-CM

## 2019-08-01 DIAGNOSIS — I471 Supraventricular tachycardia: Secondary | ICD-10-CM | POA: Diagnosis not present

## 2019-08-01 DIAGNOSIS — E876 Hypokalemia: Secondary | ICD-10-CM

## 2019-08-01 DIAGNOSIS — I25118 Atherosclerotic heart disease of native coronary artery with other forms of angina pectoris: Secondary | ICD-10-CM

## 2019-08-01 MED ORDER — LOSARTAN POTASSIUM 25 MG PO TABS: 25.0000 mg | ORAL_TABLET | Freq: Every day | ORAL | 1 refills | Status: DC

## 2019-08-01 NOTE — Patient Instructions (Signed)
Your physician recommends that you schedule a follow-up appointment in: [place] physician has recommended you make the following change in your medication:   START LOSARTAN 25 [place] physician recommends that you return for lab work BMP/MG  Non-Cardiac CT Angiography (CTA), is a special type of CT scan that uses a computer to produce multi-dimensional views of major blood vessels throughout the body. In CT angiography, a contrast material is injected through an IV to help visualize the blood vessels   Thank you for choosing [facility]!!

## 2019-08-01 NOTE — Progress Notes (Signed)
SUBJECTIVE: Connie Ho is a 58 y.o. female with a history of PSVT, nonobstructive coronary artery disease, and hypertension. Coronary angiography on 10/07/2011 showed a 60% ostial lesion and a small diagonal branch less than 2 mm diameter. No angioplasty was recommended at that time.  She had been experiencing problems with leg and feet cramps and her potassium was mildly low.  Her magnesium was also low normal.  I prescribed supplementation.  Her blood pressures have remained elevated.  They have gotten as high as 200/126.  She continues to have leg and feet cramps primarily in the right foot and right big toe but they have considerably decreased in frequency and intensity since starting potassium supplementation.  She does not take Lasix.  She has occasional palpitations associated with shortness of breath.  At times she takes an extra dose of Toprol-XL.  She is under a lot of stress as her 59 year old sister is dying of liver cancer.  They are very close.  She lives in [place], [place], with her husband who is a Company secretary.    Review of Systems: As per "subjective", otherwise negative.  Allergies  Allergen Reactions  . Peanut-Containing Drug Products Hives and Swelling    Stayed in hospital for three days; "swelled from inside out"  . Hydrocodone Itching    Itching occurs with benadryl  . Aspirin Hives  . Ciprofloxacin Itching and Rash  . Ibuprofen Hives    Current Outpatient Medications  Medication Sig Dispense Refill  . albuterol (PROVENTIL) (2.5 MG/3ML) 0.083% nebulizer solution Take 2.5 mg by nebulization every 6 (six) hours as needed for wheezing.    . diltiazem (TIAZAC) 180 MG 24 hr capsule Take 180 mg by mouth 2 (two) times daily.  1  . furosemide (LASIX) 40 MG tablet Take 40 mg by mouth daily as needed.    . Magnesium 400 MG TABS Take 400 mg by mouth daily. 90 tablet 3  . metoprolol succinate (TOPROL-XL) 50 MG 24 hr tablet Take 1 tablet (50 mg total) by  mouth daily. 30 tablet 11  . ondansetron (ZOFRAN ODT) 4 MG disintegrating tablet Take 1 tablet (4 mg total) by mouth every 8 (eight) hours as needed for nausea or vomiting. 20 tablet 0  . pantoprazole (PROTONIX) 40 MG tablet Take 1 tablet (40 mg total) by mouth 2 (two) times daily. OFFICE VISIT FOR FUTURE REFILLS 180 tablet 0  . potassium chloride SA (K-DUR) 20 MEQ tablet Take 2 tablets (40 mEq total) by mouth daily. Take 40 meq daily 90 tablet 3   No current facility-administered medications for this visit.     Past Medical History:  Diagnosis Date  . CAD (coronary artery disease)    Nonobstructive, 09/2011 (false positive GXT)  . Cardiomegaly   . Chest pain, unspecified 2008   Cardiac Catheterization   . Chronic hepatitis C without mention of hepatic coma   . Degenerative disk disease   . First degree atrioventricular block   . HTN (hypertension)   . Obesity, unspecified   . Other and unspecified hyperlipidemia   . Other premature beats   . Palpitations   . Polycythemia vera(238.4)   . Postinflammatory pulmonary fibrosis ([place])   . PSVT (paroxysmal supraventricular tachycardia) ([place])   . Pure hypercholesterolemia   . Unspecified asthma(493.90)   . Unspecified asthma(493.90)   . Unspecified essential hypertension     Past Surgical History:  Procedure Laterality Date  . ABDOMINAL HYSTERECTOMY     Bilateral salpingo-oophorectomy  .  CARDIAC CATHETERIZATION  2008   mild CAD. 50% diagonal stenosis.   . CHOLECYSTECTOMY     post  . SINUS SURGERY WITH INSTATRAK  1995    Social History   Socioeconomic History  . Marital status: Married    Spouse name: STEVEN   . Number of children: 4  . Years of education: Not on file  . Highest education level: Not on file  Occupational History  . Occupation: SELF EMPLOYED  Social Needs  . Financial resource strain: Not on file  . Food insecurity    Worry: Not on file    Inability: Not on file  . Transportation needs    Medical: Not  on file    Non-medical: Not on file  Tobacco Use  . Smoking status: Never Smoker  . Smokeless tobacco: Never Used  Substance and Sexual Activity  . Alcohol use: No    Alcohol/week: 0.0 standard drinks  . Drug use: No  . Sexual activity: Yes    Partners: Male  Lifestyle  . Physical activity    Days per week: Not on file    Minutes per session: Not on file  . Stress: Not on file  Relationships  . Social Herbalist on phone: Not on file    Gets together: Not on file    Attends religious service: Not on file    Active member of club or organization: Not on file    Attends meetings of clubs or organizations: Not on file    Relationship status: Not on file  . Intimate partner violence    Fear of current or ex partner: Not on file    Emotionally abused: Not on file    Physically abused: Not on file    Forced sexual activity: Not on file  Other Topics Concern  . Not on file  Social History Narrative   She lives with husband, daughter, mother, and mother-in-law.   She does alterations (own business).   Highest level of education:  High school           Vitals:   08/01/19 1135  BP: (!) 165/83  Pulse: 64  SpO2: 98%  Weight: 209 lb 3.2 oz (94.9 kg)  Height: 5\' 6"  (1.676 m)    Wt Readings from Last 3 Encounters:  08/01/19 209 lb 3.2 oz (94.9 kg)  04/20/19 202 lb (91.6 kg)  07/21/18 216 lb (98 kg)     PHYSICAL EXAM General: NAD HEENT: Normal. Neck: No JVD, no thyromegaly. Lungs: Clear to auscultation bilaterally with normal respiratory effort. CV: Regular rate and rhythm, normal S1/S2, no S3/S4, no murmur. No pretibial or periankle edema.  No carotid bruit.   Abdomen: Soft, nontender, no distention.  Neurologic: Alert and oriented.  Psych: Normal affect. Skin: Normal. Musculoskeletal: No gross deformities.    ECG: Reviewed above under Subjective   Labs: Lab Results  Component Value Date/Time   K 3.7 06/15/2019 01:28 PM   BUN 9 06/15/2019 01:28  PM   CREATININE 0.59 06/15/2019 01:28 PM   ALT 23 01/17/2019 05:44 PM   TSH 0.58 04/24/2015 03:27 PM   HGB 15.8 (H) 01/17/2019 05:44 PM     Lipids: No results found for: LDLCALC, LDLDIRECT, CHOL, TRIG, HDL    Coronary angiography on 10/07/2011 showed a 60% ostial lesion and a small diagonal branch less than 2 mm diameter.  Echo 06/03/18:  Study Conclusions  - Left ventricle: The cavity size was normal. Wall thickness was increased in  a pattern of mild LVH. Systolic function was normal. The estimated ejection fraction was in the range of 55% to 60%. Wall motion was normal; there were no regional wall motion abnormalities. Findings consistent with left ventricular diastolic dysfunction, grade indeterminate. - Aortic valve: Trileaflet; mildly thickened leaflets. There was mild regurgitation.  Event monitor 05/24/2019:   Sinus rhythm with frequent PAC's and occasional PVC's. Paroxysms of SVT seen.     ASSESSMENT AND PLAN:  1. Paroxysmal supraventricular tachycardia/palpitations: Currently on long-acting diltiazem 180 mg twice daily and Toprol-XL 50 mg daily.  Event monitoring reviewed above showed sinus rhythm with frequent PACs, occasional PVCs, and paroxysms of SVT.  I have already made an EP referral for ablation consideration but she does not want to pursue this at this time.  2. Hypertension: BP is elevated.  I will add losartan 25 mg daily.  3. Coronary artery disease: Coronary angiogram from 2012 reviewed above.She has had some exertional dyspnea. I would consider stress testing in the future. Continue medical therapy with Toprol-XL.   4. Ascending thoracic aortic aneurysm: 4 cm in diameter as noted above.She is already on a beta-blocker.I will obtain a follow-up CT angiogram.  5. Hypokalemia/calf and feet cramps: Symptomatic improvement with supplemental potassium and magnesium.  I will check a basic metabolic panel and magnesium level.  6.   Hypokalemia: Potassium 3.7 on 06/15/2019.  She is on supplementation. I will check a BMET.   Disposition: Follow up 3 months   Kate Sable, M.D., F.A.C.C.

## 2019-08-01 NOTE — Telephone Encounter (Signed)
Pre-cert Verification for the following procedure    CT ANGIO CHEST AORTA W/CM  Scheduled for 08-18-2019 @ [facility]

## 2019-08-14 ENCOUNTER — Encounter: Payer: Self-pay | Admitting: Internal Medicine

## 2019-08-16 ENCOUNTER — Other Ambulatory Visit (HOSPITAL_COMMUNITY)
Admission: RE | Admit: 2019-08-16 | Discharge: 2019-08-16 | Disposition: A | Discharge status: Home / Self Care | Payer: BC Managed Care – PPO | Source: Ambulatory Visit | Attending: Cardiovascular Disease | Admitting: Cardiovascular Disease

## 2019-08-16 ENCOUNTER — Other Ambulatory Visit: Payer: Self-pay

## 2019-08-16 DIAGNOSIS — E878 Other disorders of electrolyte and fluid balance, not elsewhere classified: Secondary | ICD-10-CM | POA: Diagnosis present

## 2019-08-16 LAB — BASIC METABOLIC PANEL
Anion gap: 9 (ref 5–15)
BUN: 9 mg/dL (ref 6–20)
CO2: 26 mmol/L (ref 22–32)
Calcium: 9.6 mg/dL (ref 8.9–10.3)
Chloride: 104 mmol/L (ref 98–111)
Creatinine, Ser: 0.56 mg/dL (ref 0.44–1.00)
GFR calc Af Amer: >60 mL/min (ref >60)
GFR calc non Af Amer: >60 mL/min (ref >60)
Glucose, Bld: 124 mg/dL — ABNORMAL HIGH (ref 70–99)
Potassium: 3.9 mmol/L (ref 3.5–5.1)
Sodium: 139 mmol/L (ref 135–145)

## 2019-08-16 LAB — MAGNESIUM: Magnesium: 2.2 mg/dL (ref 1.7–2.4)

## 2019-08-18 ENCOUNTER — Ambulatory Visit (HOSPITAL_COMMUNITY)
Admission: RE | Admit: 2019-08-18 | Discharge: 2019-08-18 | Disposition: A | Discharge status: Home / Self Care | Payer: BC Managed Care – PPO | Source: Ambulatory Visit | Attending: Cardiovascular Disease | Admitting: Cardiovascular Disease

## 2019-08-18 ENCOUNTER — Other Ambulatory Visit: Payer: Self-pay

## 2019-08-18 DIAGNOSIS — I712 Thoracic aortic aneurysm, without rupture, unspecified: Secondary | ICD-10-CM

## 2019-08-18 MED ORDER — IOHEXOL 350 MG/ML SOLN
100.0000 mL | Freq: Once | INTRAVENOUS | Status: AC | PRN
  Administered 2019-08-18: 13:00:00 100 mL via INTRAVENOUS

## 2019-08-28 ENCOUNTER — Telehealth: Payer: Self-pay | Admitting: *Deleted

## 2019-08-28 NOTE — Telephone Encounter (Signed)
Notes recorded by Laurine Blazer, LPN on [id number] at [id number] PM EDT  Patient notified. Copy to pmd.  ------   Notes recorded by Laurine Blazer, LPN on [id number] at [id number] PM EDT  Left message to return call.   ------   Notes recorded by Herminio Commons, MD on 08/22/2019 at 8:40 AM EDT  Stable since prior study. Plan to repeat in 1 year.

## 2019-10-04 ENCOUNTER — Other Ambulatory Visit: Payer: Self-pay | Admitting: Neurological Surgery

## 2019-10-04 ENCOUNTER — Other Ambulatory Visit (HOSPITAL_COMMUNITY): Payer: Self-pay | Admitting: Neurological Surgery

## 2019-10-04 DIAGNOSIS — M5416 Radiculopathy, lumbar region: Secondary | ICD-10-CM

## 2019-10-05 ENCOUNTER — Other Ambulatory Visit: Payer: Self-pay

## 2019-10-05 ENCOUNTER — Ambulatory Visit (HOSPITAL_COMMUNITY)
Admission: RE | Admit: 2019-10-05 | Discharge: 2019-10-05 | Disposition: A | Discharge status: Home / Self Care | Payer: BC Managed Care – PPO | Source: Ambulatory Visit | Attending: Neurological Surgery | Admitting: Neurological Surgery

## 2019-10-05 DIAGNOSIS — M5416 Radiculopathy, lumbar region: Secondary | ICD-10-CM | POA: Diagnosis present

## 2019-10-09 ENCOUNTER — Telehealth: Payer: Self-pay | Admitting: Cardiology

## 2019-10-09 NOTE — Telephone Encounter (Signed)
error 

## 2019-11-07 ENCOUNTER — Ambulatory Visit: Payer: BC Managed Care – PPO | Admitting: Cardiovascular Disease

## 2019-11-30 ENCOUNTER — Other Ambulatory Visit: Payer: Self-pay | Admitting: Student

## 2020-01-22 ENCOUNTER — Encounter: Payer: Self-pay | Admitting: Cardiovascular Disease

## 2020-01-22 ENCOUNTER — Other Ambulatory Visit: Payer: Self-pay

## 2020-01-22 ENCOUNTER — Ambulatory Visit (INDEPENDENT_AMBULATORY_CARE_PROVIDER_SITE_OTHER): Payer: BC Managed Care – PPO | Admitting: Cardiovascular Disease

## 2020-01-22 ENCOUNTER — Encounter: Payer: Self-pay | Admitting: *Deleted

## 2020-01-22 VITALS — BP 152/80 | HR 63 | Ht 66.0 in | Wt 209.8 lb

## 2020-01-22 DIAGNOSIS — I471 Supraventricular tachycardia: Secondary | ICD-10-CM | POA: Diagnosis not present

## 2020-01-22 DIAGNOSIS — I712 Thoracic aortic aneurysm, without rupture, unspecified: Secondary | ICD-10-CM

## 2020-01-22 DIAGNOSIS — E876 Hypokalemia: Secondary | ICD-10-CM | POA: Diagnosis not present

## 2020-01-22 DIAGNOSIS — I25118 Atherosclerotic heart disease of native coronary artery with other forms of angina pectoris: Secondary | ICD-10-CM

## 2020-01-22 DIAGNOSIS — I10 Essential (primary) hypertension: Secondary | ICD-10-CM

## 2020-01-22 DIAGNOSIS — R252 Cramp and spasm: Secondary | ICD-10-CM

## 2020-01-22 DIAGNOSIS — R002 Palpitations: Secondary | ICD-10-CM

## 2020-01-22 NOTE — Progress Notes (Addendum)
SUBJECTIVE: Connie Crane Bradfordis a 59 y.o.femalewith a history of PSVT, nonobstructive coronary artery disease, and hypertension. Coronary angiography on 10/07/2011 showed a 60% ostial lesion and a small diagonal branch less than 2 mm diameter. No angioplasty was recommended at that time.  She is under a lot of stress as her 74 year old sister is dying of liver cancer.  They are very close.  She lives in [place], [place], with her husband who is a Company secretary.  Her brother also recently suddenly died of a stroke at age 21.  She has been having more acid reflux disease and has been taking a lot of Tums.  She had an episode of weakness about 2 weeks ago and her blood pressure was down to 90/54 with a heart rate in the 40 bpm range.  She stopped taking all of her blood pressure medications for 3 days and eventually symptoms resolved.  She also drank 3 bottles of water because she thought maybe she was dehydrated.  She denied having fevers and chills as well as dysuria.  She has episodic chest tightness which is self-limiting and spontaneously resolves.  She denies any significant palpitations.  I personally reviewed the ECG performed in our office today which demonstrates sinus rhythm with an isolated PAC.    Review of Systems: As per "subjective", otherwise negative.  Allergies  Allergen Reactions  . Peanut-Containing Drug Products Hives and Swelling    Stayed in hospital for three days; "swelled from inside out"  . Hydrocodone Itching    Itching occurs with benadryl  . Aspirin Hives  . Ciprofloxacin Itching and Rash  . Ibuprofen Hives    Current Outpatient Medications  Medication Sig Dispense Refill  . albuterol (PROVENTIL) (2.5 MG/3ML) 0.083% nebulizer solution Take 2.5 mg by nebulization every 6 (six) hours as needed for wheezing.    . diltiazem (TIAZAC) 180 MG 24 hr capsule Take 180 mg by mouth 2 (two) times daily.  1  . losartan (COZAAR) 25 MG tablet Take 1 tablet (25  mg total) by mouth daily. 90 tablet 1  . Magnesium 400 MG TABS Take 400 mg by mouth daily. 90 tablet 3  . metoprolol succinate (TOPROL-XL) 50 MG 24 hr tablet TAKE ONE TABLET BY MOUTH EVERY DAY 30 tablet 11  . ondansetron (ZOFRAN ODT) 4 MG disintegrating tablet Take 1 tablet (4 mg total) by mouth every 8 (eight) hours as needed for nausea or vomiting. 20 tablet 0  . pantoprazole (PROTONIX) 40 MG tablet Take 1 tablet (40 mg total) by mouth 2 (two) times daily. OFFICE VISIT FOR FUTURE REFILLS 180 tablet 0  . potassium chloride SA (K-DUR) 20 MEQ tablet Take 2 tablets (40 mEq total) by mouth daily. Take 40 meq daily 90 tablet 3   No current facility-administered medications for this visit.    Past Medical History:  Diagnosis Date  . CAD (coronary artery disease)    Nonobstructive, 09/2011 (false positive GXT)  . Cardiomegaly   . Chest pain, unspecified 2008   Cardiac Catheterization   . Chronic hepatitis C without mention of hepatic coma   . Degenerative disk disease   . First degree atrioventricular block   . HTN (hypertension)   . Obesity, unspecified   . Other and unspecified hyperlipidemia   . Other premature beats   . Palpitations   . Polycythemia vera(238.4)   . Postinflammatory pulmonary fibrosis ([place])   . PSVT (paroxysmal supraventricular tachycardia) ([place])   . Pure hypercholesterolemia   . Unspecified  asthma(493.90)   . Unspecified asthma(493.90)   . Unspecified essential hypertension     Past Surgical History:  Procedure Laterality Date  . ABDOMINAL HYSTERECTOMY     Bilateral salpingo-oophorectomy  . CARDIAC CATHETERIZATION  2008   mild CAD. 50% diagonal stenosis.   . CHOLECYSTECTOMY     post  . SINUS SURGERY WITH INSTATRAK  1995    Social History   Socioeconomic History  . Marital status: Married    Spouse name: STEVEN   . Number of children: 4  . Years of education: Not on file  . Highest education level: Not on file  Occupational History  . Occupation:  SELF EMPLOYED  Tobacco Use  . Smoking status: Never Smoker  . Smokeless tobacco: Never Used  Substance and Sexual Activity  . Alcohol use: No    Alcohol/week: 0.0 standard drinks  . Drug use: No  . Sexual activity: Yes    Partners: Male  Other Topics Concern  . Not on file  Social History Narrative   She lives with husband, daughter, mother, and mother-in-law.   She does alterations (own business).   Highest level of education:  High school         Social Determinants of Health   Financial Resource Strain:   . Difficulty of Paying Living Expenses: Not on file  Food Insecurity:   . Worried About Charity fundraiser in the Last Year: Not on file  . Ran Out of Food in the Last Year: Not on file  Transportation Needs:   . Lack of Transportation (Medical): Not on file  . Lack of Transportation (Non-Medical): Not on file  Physical Activity:   . Days of Exercise per Week: Not on file  . Minutes of Exercise per Session: Not on file  Stress:   . Feeling of Stress : Not on file  Social Connections:   . Frequency of Communication with Friends and Family: Not on file  . Frequency of Social Gatherings with Friends and Family: Not on file  . Attends Religious Services: Not on file  . Active Member of Clubs or Organizations: Not on file  . Attends Archivist Meetings: Not on file  . Marital Status: Not on file  Intimate Partner Violence:   . Fear of Current or Ex-Partner: Not on file  . Emotionally Abused: Not on file  . Physically Abused: Not on file  . Sexually Abused: Not on file     Vitals:   01/22/20 1338  BP: (!) 152/80  Pulse: 63  SpO2: 98%  Weight: 209 lb 12.8 oz (95.2 kg)  Height: 5\' 6"  (1.676 m)    Wt Readings from Last 3 Encounters:  01/22/20 209 lb 12.8 oz (95.2 kg)  08/01/19 209 lb 3.2 oz (94.9 kg)  04/20/19 202 lb (91.6 kg)     PHYSICAL EXAM General: NAD HEENT: Normal. Neck: No JVD, no thyromegaly. Lungs: Clear to auscultation bilaterally  with normal respiratory effort. CV: Regular rate and rhythm, normal S1/S2, no S3/S4, no murmur. No pretibial or periankle edema.   Abdomen: Soft, nontender, no distention.  Neurologic: Alert and oriented.  Psych: Normal affect. Skin: Normal. Musculoskeletal: No gross deformities.      Labs: Lab Results  Component Value Date/Time   K 3.9 08/16/2019 12:36 PM   BUN 9 08/16/2019 12:36 PM   CREATININE 0.56 08/16/2019 12:36 PM   ALT 23 01/17/2019 05:44 PM   TSH 0.58 04/24/2015 03:27 PM   HGB 15.8 (H) 01/17/2019  05:44 PM     Lipids: No results found for: LDLCALC, LDLDIRECT, CHOL, TRIG, HDL     Coronary angiography on 10/24/2012showed a 60% ostial lesion and a small diagonal branch less than 2 mm diameter.  Echo 06/03/18:  Study Conclusions  - Left ventricle: The cavity size was normal. Wall thickness was increased in a pattern of mild LVH. Systolic function was normal. The estimated ejection fraction was in the range of 55% to 60%. Wall motion was normal; there were no regional wall motion abnormalities. Findings consistent with left ventricular diastolic dysfunction, grade indeterminate. - Aortic valve: Trileaflet; mildly thickened leaflets. There was mild regurgitation.  Event monitor 05/24/2019:   Sinus rhythm with frequent PAC's and occasional PVC's. Paroxysms of SVT seen.      ASSESSMENT AND PLAN:  1. Paroxysmal supraventricular tachycardia/palpitations:Currentlyon long-acting diltiazem 180 mg twice daily and Toprol-XL 50mg  daily.  Event monitoring reviewed above showed sinus rhythm with frequent PACs, occasional PVCs, and paroxysms of SVT.  I previously made an EP referral for ablation consideration but she does not want to pursue this at this time.  Symptomatically stable.  2. Hypertension:BP is elevated.    No changes to therapy for now.  She is under a lot of stress.  3. Coronaryarterydisease: Coronary angiogram from 2012 reviewed  above.She has had some exertional dyspnea. I would consider stress testing in the future.Continue medical therapy with Toprol-XL.   4. Ascending thoracic aortic aneurysm: 4 cm in diameter by CT angiography on 08/18/2019.  It is stable.  I will repeat in September 2021.  5. Hypokalemia/calf and feet cramps: Symptomatic improvement with supplemental potassium and magnesium.   6.  Hypokalemia: Potassium normal at 3.9 on 08/16/2019.    Disposition: Follow up 6 months virtual visit   Kate Sable, M.D., F.A.C.C.

## 2020-01-22 NOTE — Patient Instructions (Signed)
Medication Instructions:  Continue all current medications.  Labwork: none  Testing/Procedures: none  Follow-Up: 6 months   Any Other Special Instructions Will Be Listed Below (If Applicable). Work note provided for today as well.   If you need a refill on your cardiac medications before your next appointment, please call your pharmacy.

## 2020-04-16 ENCOUNTER — Other Ambulatory Visit: Payer: Self-pay | Admitting: Neurological Surgery

## 2020-04-16 DIAGNOSIS — M5416 Radiculopathy, lumbar region: Secondary | ICD-10-CM

## 2020-05-11 ENCOUNTER — Other Ambulatory Visit: Payer: BC Managed Care – PPO

## 2020-05-18 ENCOUNTER — Other Ambulatory Visit: Payer: Self-pay | Admitting: Cardiovascular Disease

## 2020-07-26 ENCOUNTER — Ambulatory Visit: Payer: BC Managed Care – PPO | Admitting: Family Medicine

## 2020-07-26 ENCOUNTER — Encounter: Payer: Self-pay | Admitting: Family Medicine

## 2020-07-26 ENCOUNTER — Telehealth: Payer: BC Managed Care – PPO | Admitting: Cardiovascular Disease

## 2020-07-26 VITALS — BP 180/90 | HR 74 | Ht 66.0 in | Wt 214.0 lb

## 2020-07-26 DIAGNOSIS — I7121 Aneurysm of the ascending aorta, without rupture: Secondary | ICD-10-CM

## 2020-07-26 DIAGNOSIS — I471 Supraventricular tachycardia: Secondary | ICD-10-CM | POA: Diagnosis not present

## 2020-07-26 DIAGNOSIS — I712 Thoracic aortic aneurysm, without rupture: Secondary | ICD-10-CM | POA: Diagnosis not present

## 2020-07-26 DIAGNOSIS — I10 Essential (primary) hypertension: Secondary | ICD-10-CM | POA: Diagnosis not present

## 2020-07-26 DIAGNOSIS — I2510 Atherosclerotic heart disease of native coronary artery without angina pectoris: Secondary | ICD-10-CM | POA: Diagnosis not present

## 2020-07-26 NOTE — Progress Notes (Signed)
Cardiology Office Note  Date: 07/26/2020   ID: Connie Ho, DOB 13-Mar-1961, MRN [id number]  PCP:  Glenda Chroman, MD  Cardiologist:  Kate Sable, MD (Inactive) Electrophysiologist:  None   Chief Complaint: CAD  History of Present Illness: Connie Ho is a 59 y.o. female with a history of CAD, PSVT, hypokalemia, thoracic aneurysm without rupture, hypertension, palpitations.  Cardiac catheterization 10/07/2011: 60% ostial lesion and small diagonal branch less than 2 mm diameter.  No angioplasty recommended at that time.  Last visit with Dr. Bronson Ing 01/22/2020.  Patient was under a lot of stress due to her sister who is dying of cancer and her brother recently had died from a stroke.  She was having more acid reflux symptoms and taking a lot of Tums.  Had an episode of weakness and the blood pressure was down to 90/54 with a heart rate in the 40 range.  She stopped taking all of her blood pressure medications for 3 days and eventually symptoms resolved.  She was having episodic chest tightness which was self-limiting and spontaneously resolved.  She was taking long-acting diltiazem 180 mg p.o. twice daily and Toprol 50 mg daily for PSVT/tachycardia/palpitations.  She was symptomatically stable.  Dr. Bronson Ing had previously made a repeat EP referral for ablation but patient decided she did not want to pursue.  Blood pressure was elevated at last visit.  She was having some exertional dyspnea.  He stated he would consider stress test in the future.  Continue medical therapy with Toprol-XL.  Last CT showed ascending thoracic aortic aneurysm 4 cm on 08/18/2019.  Plans were to repeat CT in September 2021.  Patient states she has a sister who was recently diagnosed with hemochromatosis.  She also has a sister recently deceased with diagnosis of liver cancer.  She is concerned she is at risk for hereditary hemochromatosis.  She states she has not seen her primary care provider in quite  some time.  She would like for [place] to draw a ferritin level to see if she may have iron overload/hereditary hemochromatosis.  Past Medical History:  Diagnosis Date  . CAD (coronary artery disease)    Nonobstructive, 09/2011 (false positive GXT)  . Cardiomegaly   . Chest pain, unspecified 2008   Cardiac Catheterization   . Chronic hepatitis C without mention of hepatic coma   . Degenerative disk disease   . First degree atrioventricular block   . HTN (hypertension)   . Obesity, unspecified   . Other and unspecified hyperlipidemia   . Other premature beats   . Palpitations   . Polycythemia vera(238.4)   . Postinflammatory pulmonary fibrosis ([place])   . PSVT (paroxysmal supraventricular tachycardia) ([place])   . Pure hypercholesterolemia   . Unspecified asthma(493.90)   . Unspecified asthma(493.90)   . Unspecified essential hypertension     Past Surgical History:  Procedure Laterality Date  . ABDOMINAL HYSTERECTOMY     Bilateral salpingo-oophorectomy  . CARDIAC CATHETERIZATION  2008   mild CAD. 50% diagonal stenosis.   . CHOLECYSTECTOMY     post  . SINUS SURGERY WITH INSTATRAK  1995    Current Outpatient Medications  Medication Sig Dispense Refill  . albuterol (PROVENTIL) (2.5 MG/3ML) 0.083% nebulizer solution Take 2.5 mg by nebulization every 6 (six) hours as needed for wheezing.    . diltiazem (TIAZAC) 180 MG 24 hr capsule Take 180 mg by mouth 2 (two) times daily.  1  . gabapentin (NEURONTIN) 300 MG capsule Take 300  mg by mouth at bedtime as needed.    Marland Kitchen glipiZIDE (GLUCOTROL) 5 MG tablet Take 5 mg by mouth as needed.    Marland Kitchen losartan (COZAAR) 25 MG tablet TAKE ONE TABLET BY MOUTH DAILY. 90 tablet 1  . Magnesium 400 MG TABS Take 400 mg by mouth daily. 90 tablet 3  . metFORMIN (GLUCOPHAGE) 500 MG tablet Take 500 mg by mouth 2 (two) times daily.    . metoprolol succinate (TOPROL-XL) 50 MG 24 hr tablet TAKE ONE TABLET BY MOUTH EVERY DAY 30 tablet 11  . ondansetron (ZOFRAN ODT) 4 MG  disintegrating tablet Take 1 tablet (4 mg total) by mouth every 8 (eight) hours as needed for nausea or vomiting. 20 tablet 0  . pantoprazole (PROTONIX) 40 MG tablet Take 1 tablet (40 mg total) by mouth 2 (two) times daily. OFFICE VISIT FOR FUTURE REFILLS 180 tablet 0   No current facility-administered medications for this visit.   Allergies:  Peanut-containing drug products, Hydrocodone, Aspirin, Ciprofloxacin, and Ibuprofen   Social History: The patient  reports that she has never smoked. She has never used smokeless tobacco. She reports that she does not drink alcohol and does not use drugs.   Family History: The patient's family history includes Atrial fibrillation in her mother; Coronary artery disease in her mother; Diabetes type II in her mother; Fibromyalgia in her sister and sister; Heart failure in her father; Hypertension in her mother; Leukemia in her brother; Stroke (age of onset: 81) in her father.   ROS:  Please see the history of present illness. Otherwise, complete review of systems is positive for none.  All other systems are reviewed and negative.   Physical Exam: VS:  BP (!) 180/90   Pulse 74   Ht 5\' 6"  (1.676 m)   Wt 214 lb (97.1 kg)   SpO2 97%   BMI 34.54 kg/m , BMI Body mass index is 34.54 kg/m.  Wt Readings from Last 3 Encounters:  07/26/20 214 lb (97.1 kg)  01/22/20 209 lb 12.8 oz (95.2 kg)  08/01/19 209 lb 3.2 oz (94.9 kg)    General: Patient appears comfortable at rest. HEENT: Conjunctiva and lids normal, oropharynx clear with moist mucosa. Neck: Supple, no elevated JVP or carotid bruits, no thyromegaly. Lungs: Clear to auscultation, nonlabored breathing at rest. Cardiac: Regular rate and rhythm, no S3 or significant systolic murmur, no pericardial rub. Abdomen: Soft, nontender, no hepatomegaly, bowel sounds present, no guarding or rebound. Extremities: No pitting edema, distal pulses 2+. Skin: Warm and dry. Musculoskeletal: No  kyphosis. Neuropsychiatric: Alert and oriented x3, affect grossly appropriate.  ECG:    Recent Labwork: 08/16/2019: BUN 9; Creatinine, Ser 0.56; Magnesium 2.2; Potassium 3.9; Sodium 139  No results found for: CHOL, TRIG, HDL, CHOLHDL, VLDL, LDLCALC, LDLDIRECT  Other Studies Reviewed Today:   Coronary angiography on 10/24/2012showed a 60% ostial lesion and a small diagonal branch less than 2 mm diameter.  Echo 06/03/18:  Study Conclusions  - Left ventricle: The cavity size was normal. Wall thickness was increased in a pattern of mild LVH. Systolic function was normal. The estimated ejection fraction was in the range of 55% to 60%. Wall motion was normal; there were no regional wall motion abnormalities. Findings consistent with left ventricular diastolic dysfunction, grade indeterminate. - Aortic valve: Trileaflet; mildly thickened leaflets. There was mild regurgitation.  Event monitor 05/24/2019:   Sinus rhythm with frequent PAC's and occasional PVC's. Paroxysms of SVT seen.    Assessment and Plan:  1. PSVT (  paroxysmal supraventricular tachycardia) ([place])   2. CAD in native artery   3. Essential hypertension   4. Thoracic ascending aortic aneurysm ([place])   5. Hemochromatosis, unspecified hemochromatosis type    1. PSVT (paroxysmal supraventricular tachycardia) ([place]) Patient states she notices occasional palpitations but not very frequent.  When the palpitations do occur she is not symptomatic.  Continue diltiazem 180 mg daily.  Continue Toprol-XL 50 mg daily.  2. CAD in native artery No recent progressive anginal or exertional symptoms.  3. Essential hypertension Blood pressure is elevated today on arrival.  Patient states she has not been taking her blood pressure much recently but has noticed her systolic has been running in the 150s or higher recently.  Increase losartan to 50 mg daily.  Start checking your blood pressure daily for 2 weeks.  Come back in  2 weeks for nursing visit and bring a log of blood pressures with you for a blood pressure check.  At that time we will draw a BMP and magnesium.  4. Thoracic ascending aortic aneurysm ([place]) CTA 08/18/2019 showed stable fusiform aneurysmal dilatation of the ascending thoracic aorta, 4 cm maximally. Recommend annual imaging followup by CTA or MRA. Repeat CTA of the chest aorta in September 2021  5.  Screening for hemochromatosis Patient states she has a sister with hemochromatosis and 1 who recently passed away with liver cancer.  She is concerned about possible hemochromatosis.  She is requesting we order lab work to screen for possible hemochromatosis.  Please get a serum ferritin level in 2 weeks when BMP and magnesium are drawn.  Medication Adjustments/Labs and Tests Ordered: Current medicines are reviewed at length with the patient today.  Concerns regarding medicines are outlined above.    Disposition: Follow-up with Dr. Harl Bowie or APP 6 months  Signed, Levell July, NP 07/26/2020 4:26 PM    [facility] [place], [place], [place] [address] Phone: [phone]; Fax: [phone]

## 2020-07-26 NOTE — Patient Instructions (Addendum)
Medication Instructions:  Your physician has recommended you make the following change in your medication:   INCREASE Losartan to 50mg  daily  *If you need a refill on your cardiac medications before your next appointment, please call your pharmacy*   Lab Work: None today  If you have labs (blood work) drawn today and your tests are completely normal, you will receive your results only by: Marland Kitchen MyChart Message (if you have MyChart) OR . A paper copy in the mail If you have any lab test that is abnormal or we need to change your treatment, we will call you to review the results.   Testing/Procedures: Non-Cardiac CT Angiography (CTA), is a special type of CT scan that uses a computer to produce multi-dimensional views of major blood vessels throughout the body. In CT angiography, a contrast material is injected through an IV to help visualize the blood vessels   Follow-Up: At [facility], you and your health needs are our priority.  As part of our continuing mission to provide you with exceptional heart care, we have created designated Provider Care Teams.  These Care Teams include your primary Cardiologist (physician) and Advanced Practice Providers (APPs -  Physician Assistants and Nurse Practitioners) who all work together to provide you with the care you need, when you need it.  We recommend signing up for the patient portal called "MyChart".  Sign up information is provided on this After Visit Summary.  MyChart is used to connect with patients for Virtual Visits (Telemedicine).  Patients are able to view lab/test results, encounter notes, upcoming appointments, etc.  Non-urgent messages can be sent to your provider as well.   To learn more about what you can do with MyChart, go to [email or web address].    Your next appointment:   BP Check in 2 weeks  6 month(s)  The format for your next appointment:   In Person  Provider:   You may see Dr Domenic Polite or the following Advanced  Practice Provider on your designated Care Team:    Katina Dung, NP

## 2020-07-29 ENCOUNTER — Other Ambulatory Visit: Payer: Self-pay | Admitting: *Deleted

## 2020-07-29 MED ORDER — LOSARTAN POTASSIUM 25 MG PO TABS: 25.0000 mg | ORAL_TABLET | Freq: Every day | ORAL | 1 refills | Status: DC

## 2020-08-09 ENCOUNTER — Ambulatory Visit: Payer: BC Managed Care – PPO | Admitting: *Deleted

## 2020-08-09 VITALS — BP 128/82 | HR 64

## 2020-08-09 DIAGNOSIS — I10 Essential (primary) hypertension: Secondary | ICD-10-CM

## 2020-08-09 NOTE — Progress Notes (Signed)
Blood pressure looks much better.  Thank you

## 2020-08-09 NOTE — Progress Notes (Signed)
Patient in office this evening for BP check.  Last seen on 07/26/2020 by Katina Dung, NP - BP at that visit was 180/90.  Her Losartan was increased to 50mg  daily & is back today for recheck.    BP today - 128/80  64  93%  She states that her readings have been better at home as well.

## 2020-08-13 ENCOUNTER — Other Ambulatory Visit (HOSPITAL_COMMUNITY)
Admission: RE | Admit: 2020-08-13 | Discharge: 2020-08-13 | Disposition: A | Discharge status: Home / Self Care | Payer: BC Managed Care – PPO | Source: Ambulatory Visit | Attending: Family Medicine | Admitting: Family Medicine

## 2020-08-13 DIAGNOSIS — I2510 Atherosclerotic heart disease of native coronary artery without angina pectoris: Secondary | ICD-10-CM | POA: Insufficient documentation

## 2020-08-13 DIAGNOSIS — I10 Essential (primary) hypertension: Secondary | ICD-10-CM | POA: Insufficient documentation

## 2020-08-13 DIAGNOSIS — I471 Supraventricular tachycardia: Secondary | ICD-10-CM | POA: Diagnosis not present

## 2020-08-13 DIAGNOSIS — I712 Thoracic aortic aneurysm, without rupture: Secondary | ICD-10-CM | POA: Diagnosis present

## 2020-08-13 LAB — BASIC METABOLIC PANEL
Anion gap: 12 (ref 5–15)
BUN: 10 mg/dL (ref 6–20)
CO2: 24 mmol/L (ref 22–32)
Calcium: 9.5 mg/dL (ref 8.9–10.3)
Chloride: 101 mmol/L (ref 98–111)
Creatinine, Ser: 0.75 mg/dL (ref 0.44–1.00)
GFR calc Af Amer: >60 mL/min (ref >60)
GFR calc non Af Amer: >60 mL/min (ref >60)
Glucose, Bld: 233 mg/dL — ABNORMAL HIGH (ref 70–99)
Potassium: 3.8 mmol/L (ref 3.5–5.1)
Sodium: 137 mmol/L (ref 135–145)

## 2020-08-13 LAB — FERRITIN: Ferritin: 183 ng/mL (ref 11–307)

## 2020-08-13 LAB — MAGNESIUM: Magnesium: 1.8 mg/dL (ref 1.7–2.4)

## 2020-08-13 NOTE — Progress Notes (Signed)
Patient notified via detailed voice message.

## 2020-08-14 ENCOUNTER — Telehealth: Payer: Self-pay | Admitting: Family Medicine

## 2020-08-14 NOTE — Telephone Encounter (Signed)
Pt wanted to make sure she had her kidney function checked with the recent labs she just had done.   At last visit with Jonni Sanger, he stated he wanted this checked due to BP medication increasing.   Also, she's scheduled for a CT and they will need it for testing.   Patient is at work right now and will be off at 3:30  [phone]

## 2020-08-14 NOTE — Telephone Encounter (Signed)
Informed that her kidney functioning was checked and okay for ct scan. Verbalized understanding.

## 2020-08-16 ENCOUNTER — Telehealth: Payer: Self-pay | Admitting: *Deleted

## 2020-08-16 NOTE — Telephone Encounter (Signed)
Laurine Blazer, LPN  0/07/1447 1:85 PM EDT Back to Top    Notified, copy to pcp.

## 2020-08-16 NOTE — Telephone Encounter (Signed)
-----   Message from Merlene Laughter, RN sent at 08/15/2020  8:01 AM EDT -----  ----- Message ----- From: Verta Ellen., NP Sent: 08/14/2020   9:09 PM EDT To: Merlene Laughter, RN  BMET and Magnesium look good except for glucose being high. She needs better glucose control. Tell her the Ferritin level was normal. She was concerned about possible Hemachromatosis because her sister has it. Tell her the level was in a normal range. Thanks

## 2020-08-21 ENCOUNTER — Other Ambulatory Visit: Payer: Self-pay

## 2020-08-21 ENCOUNTER — Ambulatory Visit (HOSPITAL_COMMUNITY)
Admission: RE | Admit: 2020-08-21 | Discharge: 2020-08-21 | Disposition: A | Discharge status: Home / Self Care | Payer: BC Managed Care – PPO | Source: Ambulatory Visit | Attending: Family Medicine | Admitting: Family Medicine

## 2020-08-21 DIAGNOSIS — I712 Thoracic aortic aneurysm, without rupture: Secondary | ICD-10-CM | POA: Insufficient documentation

## 2020-08-21 DIAGNOSIS — I7121 Aneurysm of the ascending aorta, without rupture: Secondary | ICD-10-CM

## 2020-08-21 MED ORDER — IOHEXOL 350 MG/ML SOLN
100.0000 mL | Freq: Once | INTRAVENOUS | Status: AC | PRN
  Administered 2020-08-21: 100 mL via INTRAVENOUS

## 2020-08-27 ENCOUNTER — Telehealth: Payer: Self-pay | Admitting: *Deleted

## 2020-08-27 NOTE — Telephone Encounter (Signed)
-----   Message from Verta Ellen., NP sent at 08/22/2020  5:05 PM EDT ----- Please call the patient and let her know the CT scan showed her ascending thoracic aorta remains aneurysmal measuring approximately 4 cm.  Tell her we will do annual scans of this nature to monitor the aneurysm.  If she wants to schedule follow-up scan for next year around September 8 we can go ahead and put that in the system now.  Thank you

## 2020-09-02 ENCOUNTER — Encounter: Payer: Self-pay | Admitting: *Deleted

## 2020-09-02 NOTE — Telephone Encounter (Signed)
Patient informed. Copy sent to PCP °

## 2020-09-17 NOTE — Progress Notes (Signed)
Letter no longer needed

## 2020-11-13 ENCOUNTER — Encounter (INDEPENDENT_AMBULATORY_CARE_PROVIDER_SITE_OTHER): Payer: Self-pay | Admitting: Ophthalmology

## 2020-12-09 ENCOUNTER — Other Ambulatory Visit: Payer: Self-pay

## 2020-12-09 MED ORDER — METOPROLOL SUCCINATE ER 50 MG PO TB24
50.0000 mg | ORAL_TABLET | Freq: Every day | ORAL | 3 refills | Status: DC
Start: 2020-12-09 — End: 2021-11-11

## 2021-01-27 ENCOUNTER — Encounter: Payer: Self-pay | Admitting: Cardiology

## 2021-01-27 ENCOUNTER — Ambulatory Visit (INDEPENDENT_AMBULATORY_CARE_PROVIDER_SITE_OTHER): Payer: BC Managed Care – PPO | Admitting: Cardiology

## 2021-01-27 VITALS — BP 170/84 | HR 68 | Ht 66.5 in | Wt 212.0 lb

## 2021-01-27 DIAGNOSIS — I471 Supraventricular tachycardia: Secondary | ICD-10-CM

## 2021-01-27 DIAGNOSIS — I10 Essential (primary) hypertension: Secondary | ICD-10-CM | POA: Diagnosis not present

## 2021-01-27 DIAGNOSIS — I712 Thoracic aortic aneurysm, without rupture: Secondary | ICD-10-CM | POA: Diagnosis not present

## 2021-01-27 DIAGNOSIS — I7121 Aneurysm of the ascending aorta, without rupture: Secondary | ICD-10-CM

## 2021-01-27 NOTE — Progress Notes (Signed)
Cardiology Office Note  Date: 01/27/2021   ID: Connie Ho, DOB January 10, 1961, MRN [id number]  PCP:  Glenda Chroman, MD  Cardiologist:  Rozann Lesches, MD Electrophysiologist:  None   Chief Complaint  Patient presents with  . Cardiac follow-up    History of Present Illness: Connie Ho is a 60 y.o. female former patient of Dr. Bronson Ing now presenting to establish follow-up with me.  I reviewed her records and updated the chart.  She was last seen in August 2021 by Mr. Leonides Sake NP.  She presents for a routine visit.  We discussed her PSVT symptoms.  She does describe intermittent palpitations, indicates that these are generally brief and that she is able to control them by relaxing in most cases.  She has not had to go in to the ER in quite some time.  I personally reviewed her ECG today which shows sinus rhythm with increased voltage.  We went over her medications, she is on combination of diltiazem CD and Toprol-XL.  Also losartan for further control of blood pressure.  She is on a course of steroids with recent elevation in blood pressure, saw her PCP just recently.  She had a CTA of the chest in September 2021 showing mild aneurysmal dilatation of the ascending thoracic aorta at 4 cm.  Follow-up study is recommended for September of this year.  Past Medical History:  Diagnosis Date  . Asthma   . CAD (coronary artery disease)    Nonobstructive, 09/2011 (false positive GXT)  . Chronic hepatitis C without mention of hepatic coma   . Degenerative disk disease   . Essential hypertension   . Hyperlipidemia   . Palpitations   . Polycythemia vera(238.4)   . Postinflammatory pulmonary fibrosis ([place])   . PSVT (paroxysmal supraventricular tachycardia) ([place])     Past Surgical History:  Procedure Laterality Date  . ABDOMINAL HYSTERECTOMY     Bilateral salpingo-oophorectomy  . CARDIAC CATHETERIZATION  2008   mild CAD. 50% diagonal stenosis.   . CHOLECYSTECTOMY     post   . SINUS SURGERY WITH INSTATRAK  1995    Current Outpatient Medications  Medication Sig Dispense Refill  . albuterol (PROVENTIL) (2.5 MG/3ML) 0.083% nebulizer solution Take 2.5 mg by nebulization every 6 (six) hours as needed for wheezing.    . diltiazem (TIAZAC) 180 MG 24 hr capsule Take 180 mg by mouth 2 (two) times daily.  1  . gabapentin (NEURONTIN) 300 MG capsule Take 300 mg by mouth at bedtime as needed.    Marland Kitchen glipiZIDE (GLUCOTROL) 5 MG tablet Take 5 mg by mouth as needed.    Marland Kitchen losartan (COZAAR) 25 MG tablet Take 1 tablet (25 mg total) by mouth daily. 90 tablet 1  . Magnesium 400 MG TABS Take 400 mg by mouth daily. 90 tablet 3  . metFORMIN (GLUCOPHAGE) 500 MG tablet Take 500 mg by mouth 2 (two) times daily.    . metoprolol succinate (TOPROL-XL) 50 MG 24 hr tablet Take 1 tablet (50 mg total) by mouth daily. Take with or immediately following a meal. 90 tablet 3  . ondansetron (ZOFRAN ODT) 4 MG disintegrating tablet Take 1 tablet (4 mg total) by mouth every 8 (eight) hours as needed for nausea or vomiting. 20 tablet 0  . pantoprazole (PROTONIX) 40 MG tablet Take 1 tablet (40 mg total) by mouth 2 (two) times daily. OFFICE VISIT FOR FUTURE REFILLS 180 tablet 0  . predniSONE (DELTASONE) 10 MG tablet Take 10 mg  by mouth daily with breakfast.     No current facility-administered medications for this visit.   Allergies:  Peanut-containing drug products, Hydrocodone, Aspirin, Ciprofloxacin, and Ibuprofen   ROS: No syncope.  Physical Exam: VS:  BP (!) 170/84   Pulse 68   Ht 5' 6.5" (1.689 m)   Wt 212 lb (96.2 kg)   SpO2 98%   BMI 33.71 kg/m , BMI Body mass index is 33.71 kg/m.  Wt Readings from Last 3 Encounters:  01/27/21 212 lb (96.2 kg)  07/26/20 214 lb (97.1 kg)  01/22/20 209 lb 12.8 oz (95.2 kg)    General: Patient appears comfortable at rest. HEENT: Conjunctiva and lids normal, wearing a mask. Neck: Supple, no elevated JVP or carotid bruits, no thyromegaly. Lungs: Clear to  auscultation, nonlabored breathing at rest. Cardiac: Regular rate and rhythm, no S3 or significant systolic murmur, no pericardial rub. Extremities: No pitting edema.  ECG:  An ECG dated 01/22/2020 was personally reviewed today and demonstrated:  Sinus rhythm with PAC.  Recent Labwork: 08/13/2020: BUN 10; Creatinine, Ser 0.75; Magnesium 1.8; Potassium 3.8; Sodium 137, ferritin 183  Other Studies Reviewed Today:  Echocardiogram 06/03/2018:  - Left ventricle: The cavity size was normal. Wall thickness was  increased in a pattern of mild LVH. Systolic function was normal.  The estimated ejection fraction was in the range of 55% to 60%.  Wall motion was normal; there were no regional wall motion  abnormalities. Findings consistent with left ventricular  diastolic dysfunction, grade indeterminate.  - Aortic valve: Trileaflet; mildly thickened leaflets. There was  mild regurgitation.   Cardiac monitor June 2020:  Sinus rhythm with frequent PAC's and occasional PVC's. Paroxysms of SVT seen.  Assessment and Plan:  1.  PSVT, currently on diltiazem CD and Toprol-XL.  ECG reviewed today.  She is satisfied with current symptom control and will continue with observation.  I did talk with her about possibility of EP consultation to discuss ablation if necessary.  2.  Asymptomatic, mildly dilated ascending thoracic aorta at 4 cm by chest CTA in September 2021.  Follow-up study will be arranged for this year.  3.  Essential hypertension, blood pressure elevated in the setting of course of steroids.  May need further up titration of losartan.  Keep follow-up with PCP.  Medication Adjustments/Labs and Tests Ordered: Current medicines are reviewed at length with the patient today.  Concerns regarding medicines are outlined above.   Tests Ordered: Orders Placed This Encounter  Procedures  . CT ANGIO CHEST AORTA W/CM & OR WO/CM  . EKG 12-Lead    Medication Changes: No orders of the  defined types were placed in this encounter.   Disposition:  Follow up 6 months in the [place] office.  Signed, Satira Sark, MD, [facility] 01/27/2021 4:16 PM    [place] [place], [place], [place] [address] Phone: [phone]; Fax: [phone]

## 2021-01-27 NOTE — Patient Instructions (Signed)

## 2021-02-19 ENCOUNTER — Other Ambulatory Visit: Payer: Self-pay

## 2021-02-19 ENCOUNTER — Encounter (INDEPENDENT_AMBULATORY_CARE_PROVIDER_SITE_OTHER): Payer: BC Managed Care – PPO | Admitting: Ophthalmology

## 2021-02-19 DIAGNOSIS — E113292 Type 2 diabetes mellitus with mild nonproliferative diabetic retinopathy without macular edema, left eye: Secondary | ICD-10-CM

## 2021-02-19 DIAGNOSIS — H35372 Puckering of macula, left eye: Secondary | ICD-10-CM

## 2021-02-19 DIAGNOSIS — I10 Essential (primary) hypertension: Secondary | ICD-10-CM | POA: Diagnosis not present

## 2021-02-19 DIAGNOSIS — H35033 Hypertensive retinopathy, bilateral: Secondary | ICD-10-CM | POA: Diagnosis not present

## 2021-02-19 DIAGNOSIS — D3131 Benign neoplasm of right choroid: Secondary | ICD-10-CM

## 2021-08-06 ENCOUNTER — Encounter: Payer: Self-pay | Admitting: Cardiology

## 2021-08-06 ENCOUNTER — Ambulatory Visit (INDEPENDENT_AMBULATORY_CARE_PROVIDER_SITE_OTHER): Payer: BC Managed Care – PPO | Admitting: Cardiology

## 2021-08-06 VITALS — BP 140/90 | HR 66 | Ht 66.5 in | Wt 208.8 lb

## 2021-08-06 DIAGNOSIS — I7121 Aneurysm of the ascending aorta, without rupture: Secondary | ICD-10-CM

## 2021-08-06 DIAGNOSIS — I471 Supraventricular tachycardia: Secondary | ICD-10-CM

## 2021-08-06 DIAGNOSIS — I712 Thoracic aortic aneurysm, without rupture: Secondary | ICD-10-CM

## 2021-08-06 NOTE — Progress Notes (Signed)
Cardiology Office Note  Date: 08/06/2021   ID: Connie Ho, DOB 1961-01-05, MRN [id number]  PCP:  Glenda Chroman, MD  Cardiologist:  Rozann Lesches, MD Electrophysiologist:  None   Chief Complaint  Patient presents with   Cardiac follow-up    History of Present Illness: Connie Ho is a 60 y.o. female last seen in February.  She is here for a routine visit.  Reports intermittent sense of palpitations, nothing progressive or associated with syncope.  She continues to work full-time at [employer].  Follow-up chest CT scheduled for September for reassessment of ascending thoracic dilatation, 4 cm as of September 2021.  I went over her medications which are noted below.  She remains on Toprol-XL and diltiazem CD with resting heart rate in the 60s today.  Past Medical History:  Diagnosis Date   Asthma    CAD (coronary artery disease)    Nonobstructive, 09/2011 (false positive GXT)   Chronic hepatitis C without mention of hepatic coma    Degenerative disk disease    Essential hypertension    Hyperlipidemia    Palpitations    Polycythemia vera(238.4)    Postinflammatory pulmonary fibrosis (HCC)    PSVT (paroxysmal supraventricular tachycardia) ([place])     Past Surgical History:  Procedure Laterality Date   ABDOMINAL HYSTERECTOMY     Bilateral salpingo-oophorectomy   CARDIAC CATHETERIZATION  2008   mild CAD. 50% diagonal stenosis.    CHOLECYSTECTOMY     post   SINUS SURGERY WITH INSTATRAK  1995    Current Outpatient Medications  Medication Sig Dispense Refill   albuterol (PROVENTIL) (2.5 MG/3ML) 0.083% nebulizer solution Take 2.5 mg by nebulization every 6 (six) hours as needed for wheezing.     diltiazem (TIAZAC) 180 MG 24 hr capsule Take 180 mg by mouth 2 (two) times daily.  1   gabapentin (NEURONTIN) 300 MG capsule Take 300 mg by mouth at bedtime as needed.     glipiZIDE (GLUCOTROL) 5 MG tablet Take 5 mg by mouth as needed.     losartan (COZAAR) 25 MG  tablet Take 1 tablet (25 mg total) by mouth daily. 90 tablet 1   Magnesium 400 MG TABS Take 400 mg by mouth daily. 90 tablet 3   metFORMIN (GLUCOPHAGE) 500 MG tablet Take 500 mg by mouth 2 (two) times daily.     metoprolol succinate (TOPROL-XL) 50 MG 24 hr tablet Take 1 tablet (50 mg total) by mouth daily. Take with or immediately following a meal. 90 tablet 3   ondansetron (ZOFRAN ODT) 4 MG disintegrating tablet Take 1 tablet (4 mg total) by mouth every 8 (eight) hours as needed for nausea or vomiting. 20 tablet 0   pantoprazole (PROTONIX) 40 MG tablet Take 1 tablet (40 mg total) by mouth 2 (two) times daily. OFFICE VISIT FOR FUTURE REFILLS 180 tablet 0   No current facility-administered medications for this visit.   Allergies:  Peanut-containing drug products, Hydrocodone, Aspirin, Ciprofloxacin, and Ibuprofen   ROS: No syncope.  Physical Exam: VS:  BP 140/90   Pulse 66   Ht 5' 6.5" (1.689 m)   Wt 208 lb 12.8 oz (94.7 kg)   SpO2 98%   BMI 33.20 kg/m , BMI Body mass index is 33.2 kg/m.  Wt Readings from Last 3 Encounters:  08/06/21 208 lb 12.8 oz (94.7 kg)  01/27/21 212 lb (96.2 kg)  07/26/20 214 lb (97.1 kg)    General: Patient appears comfortable at rest. HEENT: Conjunctiva  and lids normal, wearing a mask. Neck: Supple, no elevated JVP or carotid bruits, no thyromegaly. Lungs: Clear to auscultation, nonlabored breathing at rest. Cardiac: Regular rate and rhythm, no S3 or significant systolic murmur, no pericardial rub. Extremities: No pitting edema.  ECG:  An ECG dated 01/27/2021 was personally reviewed today and demonstrated:  Sinus rhythm with increased voltage.  Recent Labwork: 08/13/2020: BUN 10; Creatinine, Ser 0.75; Magnesium 1.8; Potassium 3.8; Sodium 137  No results found for: CHOL, TRIG, HDL, CHOLHDL, VLDL, LDLCALC, LDLDIRECT  Other Studies Reviewed Today:  Echocardiogram 06/03/2018:  - Left ventricle: The cavity size was normal. Wall thickness was    increased  in a pattern of mild LVH. Systolic function was normal.    The estimated ejection fraction was in the range of 55% to 60%.    Wall motion was normal; there were no regional wall motion    abnormalities. Findings consistent with left ventricular    diastolic dysfunction, grade indeterminate.  - Aortic valve: Trileaflet; mildly thickened leaflets. There was    mild regurgitation.    Cardiac monitor June 2020: Sinus rhythm with frequent PAC's and occasional PVC's. Paroxysms of SVT seen.  Assessment and Plan:  1.  PSVT.  Continue combination of Toprol-XL and Cardizem CD.  Symptom control is adequate at this time per discussion.  EP consultation could be considered for ablation if symptoms worsen and no further room for medication adjustment.  2.  Asymptomatic, mildly dilated ascending thoracic aorta at 4 cm by chest CT in September 2021.  Follow-up study is pending for next month.  Medication Adjustments/Labs and Tests Ordered: Current medicines are reviewed at length with the patient today.  Concerns regarding medicines are outlined above.   Tests Ordered: Orders Placed This Encounter  Procedures   Basic metabolic panel    Medication Changes: No orders of the defined types were placed in this encounter.   Disposition:  Follow up  1 year.  Signed, Satira Sark, MD, [facility] 08/06/2021 4:21 PM    [facility], [place], [place] [address] Phone: [phone]; Fax: [phone]

## 2021-08-06 NOTE — Patient Instructions (Addendum)
Medication Instructions:  Your physician recommends that you continue on your current medications as directed. Please refer to the Current Medication list given to you today.  Labwork: BMET before your ct scan-this can be done at [facility]  Testing/Procedures: Keep CT scan already scheduled  Follow-Up: Your physician recommends that you schedule a follow-up appointment in: 1 year. You will receive a reminder call or letter in the mail in about 10 months reminding you to call and schedule your appointment. If you don't receive this letter, please contact our office.  Any Other Special Instructions Will Be Listed Below (If Applicable).  If you need a refill on your cardiac medications before your next appointment, please call your pharmacy.

## 2021-08-21 ENCOUNTER — Other Ambulatory Visit: Payer: Self-pay

## 2021-08-21 ENCOUNTER — Other Ambulatory Visit (HOSPITAL_COMMUNITY)
Admission: RE | Admit: 2021-08-21 | Discharge: 2021-08-21 | Disposition: A | Discharge status: Home / Self Care | Payer: BC Managed Care – PPO | Source: Ambulatory Visit | Attending: Cardiology | Admitting: Cardiology

## 2021-08-21 DIAGNOSIS — I471 Supraventricular tachycardia: Secondary | ICD-10-CM | POA: Insufficient documentation

## 2021-08-21 DIAGNOSIS — I7121 Aneurysm of the ascending aorta, without rupture: Secondary | ICD-10-CM

## 2021-08-21 DIAGNOSIS — I712 Thoracic aortic aneurysm, without rupture: Secondary | ICD-10-CM | POA: Insufficient documentation

## 2021-08-21 LAB — BASIC METABOLIC PANEL
Anion gap: 11 (ref 5–15)
BUN: 10 mg/dL (ref 6–20)
CO2: 25 mmol/L (ref 22–32)
Calcium: 9.5 mg/dL (ref 8.9–10.3)
Chloride: 100 mmol/L (ref 98–111)
Creatinine, Ser: 0.71 mg/dL (ref 0.44–1.00)
GFR, Estimated: >60 mL/min (ref >60)
Glucose, Bld: 163 mg/dL — ABNORMAL HIGH (ref 70–99)
Potassium: 3.2 mmol/L — ABNORMAL LOW (ref 3.5–5.1)
Sodium: 136 mmol/L (ref 135–145)

## 2021-08-22 ENCOUNTER — Ambulatory Visit (HOSPITAL_COMMUNITY)
Admission: RE | Admit: 2021-08-22 | Discharge: 2021-08-22 | Disposition: A | Discharge status: Home / Self Care | Payer: BC Managed Care – PPO | Source: Ambulatory Visit | Attending: Cardiology | Admitting: Cardiology

## 2021-08-22 DIAGNOSIS — I712 Thoracic aortic aneurysm, without rupture: Secondary | ICD-10-CM | POA: Diagnosis present

## 2021-08-22 DIAGNOSIS — I7121 Aneurysm of the ascending aorta, without rupture: Secondary | ICD-10-CM

## 2021-08-22 MED ORDER — IOHEXOL 350 MG/ML SOLN
100.0000 mL | Freq: Once | INTRAVENOUS | Status: AC | PRN
  Administered 2021-08-22: 100 mL via INTRAVENOUS

## 2021-08-27 ENCOUNTER — Telehealth: Payer: Self-pay | Admitting: *Deleted

## 2021-08-27 NOTE — Telephone Encounter (Signed)
Patient informed. Reports PCP started potassium 20 meq daily several months ago that she felled to report. Also says she has not been compliant but will start taking it daily. Medication profile updated. Copy sent to PCP

## 2021-08-27 NOTE — Telephone Encounter (Signed)
-----   Message from Satira Sark, MD sent at 08/21/2021  7:48 PM EDT ----- Results reviewed.  Renal function normal.  Potassium is mildly low at 3.2, has been low in the past previously as well.  Would suggest starting potassium supplement, KCl 10 mEq daily.

## 2021-08-29 ENCOUNTER — Encounter (INDEPENDENT_AMBULATORY_CARE_PROVIDER_SITE_OTHER): Payer: BC Managed Care – PPO | Admitting: Ophthalmology

## 2021-08-29 ENCOUNTER — Other Ambulatory Visit: Payer: Self-pay

## 2021-08-29 DIAGNOSIS — H35033 Hypertensive retinopathy, bilateral: Secondary | ICD-10-CM

## 2021-08-29 DIAGNOSIS — H35372 Puckering of macula, left eye: Secondary | ICD-10-CM

## 2021-08-29 DIAGNOSIS — I10 Essential (primary) hypertension: Secondary | ICD-10-CM

## 2021-08-29 DIAGNOSIS — E113292 Type 2 diabetes mellitus with mild nonproliferative diabetic retinopathy without macular edema, left eye: Secondary | ICD-10-CM | POA: Diagnosis not present

## 2021-08-29 DIAGNOSIS — D3131 Benign neoplasm of right choroid: Secondary | ICD-10-CM

## 2021-08-29 DIAGNOSIS — H43813 Vitreous degeneration, bilateral: Secondary | ICD-10-CM

## 2021-11-11 ENCOUNTER — Other Ambulatory Visit: Payer: Self-pay | Admitting: Cardiology

## 2022-01-28 ENCOUNTER — Encounter (INDEPENDENT_AMBULATORY_CARE_PROVIDER_SITE_OTHER): Payer: Self-pay | Admitting: *Deleted

## 2022-04-01 ENCOUNTER — Other Ambulatory Visit (INDEPENDENT_AMBULATORY_CARE_PROVIDER_SITE_OTHER): Payer: Self-pay

## 2022-04-01 ENCOUNTER — Encounter (INDEPENDENT_AMBULATORY_CARE_PROVIDER_SITE_OTHER): Payer: Self-pay

## 2022-04-01 ENCOUNTER — Encounter (INDEPENDENT_AMBULATORY_CARE_PROVIDER_SITE_OTHER): Payer: Self-pay | Admitting: Gastroenterology

## 2022-04-01 ENCOUNTER — Ambulatory Visit (INDEPENDENT_AMBULATORY_CARE_PROVIDER_SITE_OTHER): Payer: BC Managed Care – PPO | Admitting: Gastroenterology

## 2022-04-01 DIAGNOSIS — R110 Nausea: Secondary | ICD-10-CM | POA: Diagnosis not present

## 2022-04-01 DIAGNOSIS — R12 Heartburn: Secondary | ICD-10-CM | POA: Diagnosis not present

## 2022-04-01 NOTE — Progress Notes (Signed)
Maylon Peppers, M.D. ?Gastroenterology & Hepatology ?[facility] ?[address] ?[place], [place] [address] ?Primary Care Physician: ?Glenda Chroman, MD ?[address] ?[place] [address] ? ?Referring MD: PCP ? ?Chief Complaint: Nausea and heartburn ? ?History of Present Illness: ?Connie Ho is a 61 y.o. female with past medical history of asthma, coronary artery disease, hypertension, hyperlipidemia, polycythemia, PSVT, aortic thoracic aneurysm, who presents for evaluation of nausea and heartburn. ? ?Patient states that she was diagnosed with GERD for a couple of years, she cannot recall exacltly the amount of time. She was prescribed Protonix 40 mg initially which helped controlling her symptoms for some time. However, she reports he heartburn eventually flared up and she was started on Pepcid at noon time in combination with Protonix but did not provide any significant relief. Patient reports that she has presented recurrent heartburn for a little less than a year, for which she is taking Protonix 40 mg after eating breakfast and before going to sleep. She reports having frequent episodes of burning sensation in her chest, multiple times during the day. She reports it wakes her up during the night frequently. She states that the heartburn is now happening on a daily basis. Sometimes she feels the food gets stuck in the middle of her chest, but this is not frequent. No odynophagia. ? ?She reports that she has also presented recurrent nausea on a frequent basis  for the last 6 months. She is currently taking Zofran twice a day, which helps but not every single time. She has also tried Phenergan but did not help  - it caused somnolence. She denies any vomiting.  ? ?The patient denies having any vomiting, fever, chills, hematochezia, melena, hematemesis, abdominal distention, abdominal pain, diarrhea, jaundice, pruritus. Has lost 20 lb as she has been eating less  but also had a wrist surgery that limited her activities. ? ?She also endorse having some pain in the lower thoracic back area, going through the costal ridge. She has had this pain on and off for the last 6 months. ? ?Patient was followed at [facility]ology.  She was seen in 2016 for abdominal pain, epigastric pain and nausea.  She was scheduled for an EGD for evaluation of her symptoms.  Also was ordered to have a CT of the abdomen pelvis with IV contrast.  Also was ordered to have screening colonoscopy.  She had a history of positive hepatitis C antibody but her HCVRNA was checked and it was negative. ? ?Most recent blood work-up from 08/21/2021 showed a BMP with potassium of 3.2, normal electrolytes. ? ?Last EGD:2016, performed by Scarlette Shorts - normal ?Last Colonoscopy::2016, performed by Scarlette Shorts - normal ?Recommended repeat in 10 years ? ?FHx: multiple members had ulcer in GI tract, gastrointestinal/liver disease, sister liver cancer, mother pancreatic cancer ?Social: neg smoking, alcohol or illicit drug use ?Surgical: cholecystectomy, hysterectomy, appendectomy ? ?Past Medical History: ?Past Medical History:  ?Diagnosis Date  ? Asthma   ? CAD (coronary artery disease)   ? Nonobstructive, 09/2011 (false positive GXT)  ? Chronic hepatitis C without mention of hepatic coma   ? Degenerative disk disease   ? Essential hypertension   ? Hyperlipidemia   ? Palpitations   ? Polycythemia vera(238.4)   ? Postinflammatory pulmonary fibrosis (HCC)   ? PSVT (paroxysmal supraventricular tachycardia) ([place])   ? ? ?Past Surgical History: ?Past Surgical History:  ?Procedure Laterality Date  ? ABDOMINAL HYSTERECTOMY    ? Bilateral  salpingo-oophorectomy  ? CARDIAC CATHETERIZATION  2008  ? mild CAD. 50% diagonal stenosis.   ? CHOLECYSTECTOMY    ? post  ? [place]  ? ? ?Family History: ?Family History  ?Problem Relation Age of Onset  ? Stroke Father 110  ?     Deceased  ? Heart failure Father   ?  Coronary artery disease Mother   ? Atrial fibrillation Mother   ? Hypertension Mother   ? Diabetes type II Mother   ? Fibromyalgia Sister   ? Leukemia Brother   ? Fibromyalgia Sister   ? ? ?Social History: ?Social History  ? ?Tobacco Use  ?Smoking Status Never  ?Smokeless Tobacco Never  ? ?Social History  ? ?Substance and Sexual Activity  ?Alcohol Use No  ? Alcohol/week: 0.0 standard drinks  ? ?Social History  ? ?Substance and Sexual Activity  ?Drug Use No  ? ? ?Allergies: ?Allergies  ?Allergen Reactions  ? Peanut-Containing Drug Products Hives and Swelling  ?  Stayed in hospital for three days; "swelled from inside out"  ? Hydrocodone Itching  ?  Itching occurs with benadryl  ? Aspirin Hives  ? Ciprofloxacin Itching and Rash  ? Ibuprofen Hives  ? ? ?Medications: ?Current Outpatient Medications  ?Medication Sig Dispense Refill  ? albuterol (PROVENTIL) (2.5 MG/3ML) 0.083% nebulizer solution Take 2.5 mg by nebulization every 6 (six) hours as needed for wheezing.    ? diltiazem (TIAZAC) 180 MG 24 hr capsule Take 180 mg by mouth 2 (two) times daily.  1  ? empagliflozin (JARDIANCE) 25 MG TABS tablet Take 25 mg by mouth daily.    ? gabapentin (NEURONTIN) 300 MG capsule Take 300 mg by mouth at bedtime as needed.    ? glipiZIDE (GLUCOTROL) 5 MG tablet Take 5 mg by mouth as needed.    ? losartan (COZAAR) 25 MG tablet Take 1 tablet (25 mg total) by mouth daily. 90 tablet 1  ? metFORMIN (GLUCOPHAGE) 500 MG tablet Take 500 mg by mouth daily with breakfast.    ? metoprolol succinate (TOPROL-XL) 50 MG 24 hr tablet TAKE ONE TABLET BY MOUTH DAILY. TAKE WITH OR IMMEDIATELY FOLLOWING A MEAL 90 tablet 3  ? ondansetron (ZOFRAN ODT) 4 MG disintegrating tablet Take 1 tablet (4 mg total) by mouth every 8 (eight) hours as needed for nausea or vomiting. 20 tablet 0  ? pantoprazole (PROTONIX) 40 MG tablet Take 1 tablet (40 mg total) by mouth 2 (two) times daily. OFFICE VISIT FOR FUTURE REFILLS 180 tablet 0  ? potassium chloride SA (KLOR-CON  M) 20 MEQ tablet Take 20 mEq by mouth daily.    ? ?No current facility-administered medications for this visit.  ? ? ?Review of Systems: ?GENERAL: negative for malaise, night sweats ?HEENT: No changes in hearing or vision, no nose bleeds or other nasal problems. ?NECK: Negative for lumps, goiter, pain and significant neck swelling ?RESPIRATORY: Negative for cough, wheezing ?CARDIOVASCULAR: Negative for chest pain, leg swelling, palpitations, orthopnea ?GI: SEE HPI ?MUSCULOSKELETAL: Negative for joint pain or swelling, back pain, and muscle pain. ?SKIN: Negative for lesions, rash ?PSYCH: Negative for sleep disturbance, mood disorder and recent psychosocial stressors. ?HEMATOLOGY Negative for prolonged bleeding, bruising easily, and swollen nodes. ?ENDOCRINE: Negative for cold or heat intolerance, polyuria, polydipsia and goiter. ?NEURO: negative for tremor, gait imbalance, syncope and seizures. ?The remainder of the review of systems is noncontributory. ? ? ?Physical Exam: ?BP 120/79 (BP Location: Left Arm, Patient Position: Sitting, Cuff Size: Large)  Pulse 69   Temp 98.2 ?F (36.8 ?C) (Oral)   Ht 5' 6.5" (1.689 m)   Wt 199 lb 12.8 oz (90.6 kg)   BMI 31.77 kg/m?  ?GENERAL: The patient is AO x3, in no acute distress. ?HEENT: Head is normocephalic and atraumatic. EOMI are intact. Mouth is well hydrated and without lesions. ?NECK: Supple. No masses ?LUNGS: Clear to auscultation. No presence of rhonchi/wheezing/rales. Adequate chest expansion ?HEART: RRR, normal s1 and s2. ?ABDOMEN: tender upon palpation of the RLQ, no guarding, no peritoneal signs, and nondistended. BS +. No masses. ?EXTREMITIES: Without any cyanosis, clubbing, rash, lesions or edema. ?NEUROLOGIC: AOx3, no focal motor deficit. ?SKIN: no jaundice, no rashes ? ? ?Imaging/Labs: ?as above ? ?I personally reviewed and interpreted the available labs, imaging and endoscopic files. ? ?Impression and Plan: ?Connie Ho is a 61 y.o. female with past  medical history of asthma, coronary artery disease, hypertension, hyperlipidemia, polycythemia, PSVT, aortic thoracic aneurysm, who presents for evaluation of nausea and heartburn.  The patient has presented wo

## 2022-04-01 NOTE — H&P (View-Only) (Signed)
Maylon Peppers, M.D. ?Gastroenterology & Hepatology ?[facility] ?[address] ?[place], [place] [address] ?Primary Care Physician: ?Glenda Chroman, MD ?[address] ?[place] [address] ? ?Referring MD: PCP ? ?Chief Complaint: Nausea and heartburn ? ?History of Present Illness: ?Connie Ho is a 61 y.o. female with past medical history of asthma, coronary artery disease, hypertension, hyperlipidemia, polycythemia, PSVT, aortic thoracic aneurysm, who presents for evaluation of nausea and heartburn. ? ?Patient states that she was diagnosed with GERD for a couple of years, she cannot recall exacltly the amount of time. She was prescribed Protonix 40 mg initially which helped controlling her symptoms for some time. However, she reports he heartburn eventually flared up and she was started on Pepcid at noon time in combination with Protonix but did not provide any significant relief. Patient reports that she has presented recurrent heartburn for a little less than a year, for which she is taking Protonix 40 mg after eating breakfast and before going to sleep. She reports having frequent episodes of burning sensation in her chest, multiple times during the day. She reports it wakes her up during the night frequently. She states that the heartburn is now happening on a daily basis. Sometimes she feels the food gets stuck in the middle of her chest, but this is not frequent. No odynophagia. ? ?She reports that she has also presented recurrent nausea on a frequent basis  for the last 6 months. She is currently taking Zofran twice a day, which helps but not every single time. She has also tried Phenergan but did not help  - it caused somnolence. She denies any vomiting.  ? ?The patient denies having any vomiting, fever, chills, hematochezia, melena, hematemesis, abdominal distention, abdominal pain, diarrhea, jaundice, pruritus. Has lost 20 lb as she has been eating less  but also had a wrist surgery that limited her activities. ? ?She also endorse having some pain in the lower thoracic back area, going through the costal ridge. She has had this pain on and off for the last 6 months. ? ?Patient was followed at [facility] gastroenterology.  She was seen in 2016 for abdominal pain, epigastric pain and nausea.  She was scheduled for an EGD for evaluation of her symptoms.  Also was ordered to have a CT of the abdomen pelvis with IV contrast.  Also was ordered to have screening colonoscopy.  She had a history of positive hepatitis C antibody but her HCVRNA was checked and it was negative. ? ?Most recent blood work-up from 08/21/2021 showed a BMP with potassium of 3.2, normal electrolytes. ? ?Last EGD:2016, performed by Scarlette Shorts - normal ?Last Colonoscopy::2016, performed by Scarlette Shorts - normal ?Recommended repeat in 10 years ? ?FHx: multiple members had ulcer in GI tract, gastrointestinal/liver disease, sister liver cancer, mother pancreatic cancer ?Social: neg smoking, alcohol or illicit drug use ?Surgical: cholecystectomy, hysterectomy, appendectomy ? ?Past Medical History: ?Past Medical History:  ?Diagnosis Date  ? Asthma   ? CAD (coronary artery disease)   ? Nonobstructive, 09/2011 (false positive GXT)  ? Chronic hepatitis C without mention of hepatic coma   ? Degenerative disk disease   ? Essential hypertension   ? Hyperlipidemia   ? Palpitations   ? Polycythemia vera(238.4)   ? Postinflammatory pulmonary fibrosis (HCC)   ? PSVT (paroxysmal supraventricular tachycardia) ([place])   ? ? ?Past Surgical History: ?Past Surgical History:  ?Procedure Laterality Date  ? ABDOMINAL HYSTERECTOMY    ? Bilateral  salpingo-oophorectomy  ? CARDIAC CATHETERIZATION  2008  ? mild CAD. 50% diagonal stenosis.   ? CHOLECYSTECTOMY    ? post  ? [place]  ? ? ?Family History: ?Family History  ?Problem Relation Age of Onset  ? Stroke Father 32  ?     Deceased  ? Heart failure Father   ?  Coronary artery disease Mother   ? Atrial fibrillation Mother   ? Hypertension Mother   ? Diabetes type II Mother   ? Fibromyalgia Sister   ? Leukemia Brother   ? Fibromyalgia Sister   ? ? ?Social History: ?Social History  ? ?Tobacco Use  ?Smoking Status Never  ?Smokeless Tobacco Never  ? ?Social History  ? ?Substance and Sexual Activity  ?Alcohol Use No  ? Alcohol/week: 0.0 standard drinks  ? ?Social History  ? ?Substance and Sexual Activity  ?Drug Use No  ? ? ?Allergies: ?Allergies  ?Allergen Reactions  ? Peanut-Containing Drug Products Hives and Swelling  ?  Stayed in hospital for three days; "swelled from inside out"  ? Hydrocodone Itching  ?  Itching occurs with benadryl  ? Aspirin Hives  ? Ciprofloxacin Itching and Rash  ? Ibuprofen Hives  ? ? ?Medications: ?Current Outpatient Medications  ?Medication Sig Dispense Refill  ? albuterol (PROVENTIL) (2.5 MG/3ML) 0.083% nebulizer solution Take 2.5 mg by nebulization every 6 (six) hours as needed for wheezing.    ? diltiazem (TIAZAC) 180 MG 24 hr capsule Take 180 mg by mouth 2 (two) times daily.  1  ? empagliflozin (JARDIANCE) 25 MG TABS tablet Take 25 mg by mouth daily.    ? gabapentin (NEURONTIN) 300 MG capsule Take 300 mg by mouth at bedtime as needed.    ? glipiZIDE (GLUCOTROL) 5 MG tablet Take 5 mg by mouth as needed.    ? losartan (COZAAR) 25 MG tablet Take 1 tablet (25 mg total) by mouth daily. 90 tablet 1  ? metFORMIN (GLUCOPHAGE) 500 MG tablet Take 500 mg by mouth daily with breakfast.    ? metoprolol succinate (TOPROL-XL) 50 MG 24 hr tablet TAKE ONE TABLET BY MOUTH DAILY. TAKE WITH OR IMMEDIATELY FOLLOWING A MEAL 90 tablet 3  ? ondansetron (ZOFRAN ODT) 4 MG disintegrating tablet Take 1 tablet (4 mg total) by mouth every 8 (eight) hours as needed for nausea or vomiting. 20 tablet 0  ? pantoprazole (PROTONIX) 40 MG tablet Take 1 tablet (40 mg total) by mouth 2 (two) times daily. OFFICE VISIT FOR FUTURE REFILLS 180 tablet 0  ? potassium chloride SA (KLOR-CON  M) 20 MEQ tablet Take 20 mEq by mouth daily.    ? ?No current facility-administered medications for this visit.  ? ? ?Review of Systems: ?GENERAL: negative for malaise, night sweats ?HEENT: No changes in hearing or vision, no nose bleeds or other nasal problems. ?NECK: Negative for lumps, goiter, pain and significant neck swelling ?RESPIRATORY: Negative for cough, wheezing ?CARDIOVASCULAR: Negative for chest pain, leg swelling, palpitations, orthopnea ?GI: SEE HPI ?MUSCULOSKELETAL: Negative for joint pain or swelling, back pain, and muscle pain. ?SKIN: Negative for lesions, rash ?PSYCH: Negative for sleep disturbance, mood disorder and recent psychosocial stressors. ?HEMATOLOGY Negative for prolonged bleeding, bruising easily, and swollen nodes. ?ENDOCRINE: Negative for cold or heat intolerance, polyuria, polydipsia and goiter. ?NEURO: negative for tremor, gait imbalance, syncope and seizures. ?The remainder of the review of systems is noncontributory. ? ? ?Physical Exam: ?BP 120/79 (BP Location: Left Arm, Patient Position: Sitting, Cuff Size: Large)  Pulse 69   Temp 98.2 ?F (36.8 ?C) (Oral)   Ht 5' 6.5" (1.689 m)   Wt 199 lb 12.8 oz (90.6 kg)   BMI 31.77 kg/m?  ?GENERAL: The patient is AO x3, in no acute distress. ?HEENT: Head is normocephalic and atraumatic. EOMI are intact. Mouth is well hydrated and without lesions. ?NECK: Supple. No masses ?LUNGS: Clear to auscultation. No presence of rhonchi/wheezing/rales. Adequate chest expansion ?HEART: RRR, normal s1 and s2. ?ABDOMEN: tender upon palpation of the RLQ, no guarding, no peritoneal signs, and nondistended. BS +. No masses. ?EXTREMITIES: Without any cyanosis, clubbing, rash, lesions or edema. ?NEUROLOGIC: AOx3, no focal motor deficit. ?SKIN: no jaundice, no rashes ? ? ?Imaging/Labs: ?as above ? ?I personally reviewed and interpreted the available labs, imaging and endoscopic files. ? ?Impression and Plan: ?Connie Ho is a 61 y.o. female with past  medical history of asthma, coronary artery disease, hypertension, hyperlipidemia, polycythemia, PSVT, aortic thoracic aneurysm, who presents for evaluation of nausea and heartburn.  The patient has presented wo

## 2022-04-01 NOTE — Patient Instructions (Signed)
Schedule EGD  ?Continue pantoprazole 40 mg twice a day ?Continue Zofran as needed for nausea ?Explained presumed etiology of reflux symptoms. Instruction provided in the use of antireflux medication - patient should take medication in the morning 30-45 minutes before eating breakfast. Discussed avoidance of eating within 2 hours of lying down to sleep and benefit of blocks to elevate head of bed. Also, will benefit from avoiding carbonated drinks/sodas or food that has tomatoes, spicy or greasy food. ? ?

## 2022-04-03 ENCOUNTER — Encounter (INDEPENDENT_AMBULATORY_CARE_PROVIDER_SITE_OTHER): Payer: Self-pay

## 2022-04-09 ENCOUNTER — Ambulatory Visit (INDEPENDENT_AMBULATORY_CARE_PROVIDER_SITE_OTHER): Payer: BC Managed Care – PPO | Admitting: Gastroenterology

## 2022-04-15 ENCOUNTER — Encounter (HOSPITAL_COMMUNITY)
Admission: RE | Admit: 2022-04-15 | Discharge: 2022-04-15 | Disposition: A | Discharge status: Home / Self Care | Payer: BC Managed Care – PPO | Source: Ambulatory Visit | Attending: Gastroenterology | Admitting: Gastroenterology

## 2022-04-15 ENCOUNTER — Encounter (HOSPITAL_COMMUNITY): Payer: Self-pay

## 2022-04-15 ENCOUNTER — Other Ambulatory Visit: Payer: Self-pay

## 2022-04-17 ENCOUNTER — Ambulatory Visit (HOSPITAL_COMMUNITY)
Admission: RE | Admit: 2022-04-17 | Discharge: 2022-04-17 | Disposition: A | Discharge status: Home / Self Care | Payer: BC Managed Care – PPO | Attending: Gastroenterology | Admitting: Gastroenterology

## 2022-04-17 ENCOUNTER — Ambulatory Visit (HOSPITAL_COMMUNITY): Payer: BC Managed Care – PPO | Admitting: Anesthesiology

## 2022-04-17 ENCOUNTER — Encounter (HOSPITAL_COMMUNITY)
Admission: RE | Disposition: A | Discharge status: Home / Self Care | Payer: Self-pay | Source: Home / Self Care | Attending: Gastroenterology

## 2022-04-17 ENCOUNTER — Other Ambulatory Visit: Payer: Self-pay

## 2022-04-17 ENCOUNTER — Encounter (HOSPITAL_COMMUNITY): Payer: Self-pay | Admitting: Gastroenterology

## 2022-04-17 DIAGNOSIS — Z8679 Personal history of other diseases of the circulatory system: Secondary | ICD-10-CM | POA: Insufficient documentation

## 2022-04-17 DIAGNOSIS — D45 Polycythemia vera: Secondary | ICD-10-CM | POA: Insufficient documentation

## 2022-04-17 DIAGNOSIS — Z79899 Other long term (current) drug therapy: Secondary | ICD-10-CM | POA: Diagnosis not present

## 2022-04-17 DIAGNOSIS — J45909 Unspecified asthma, uncomplicated: Secondary | ICD-10-CM | POA: Insufficient documentation

## 2022-04-17 DIAGNOSIS — K219 Gastro-esophageal reflux disease without esophagitis: Secondary | ICD-10-CM | POA: Diagnosis not present

## 2022-04-17 DIAGNOSIS — I739 Peripheral vascular disease, unspecified: Secondary | ICD-10-CM | POA: Insufficient documentation

## 2022-04-17 DIAGNOSIS — I2510 Atherosclerotic heart disease of native coronary artery without angina pectoris: Secondary | ICD-10-CM | POA: Insufficient documentation

## 2022-04-17 DIAGNOSIS — I10 Essential (primary) hypertension: Secondary | ICD-10-CM | POA: Insufficient documentation

## 2022-04-17 DIAGNOSIS — E039 Hypothyroidism, unspecified: Secondary | ICD-10-CM | POA: Insufficient documentation

## 2022-04-17 DIAGNOSIS — B182 Chronic viral hepatitis C: Secondary | ICD-10-CM | POA: Insufficient documentation

## 2022-04-17 DIAGNOSIS — E785 Hyperlipidemia, unspecified: Secondary | ICD-10-CM | POA: Diagnosis not present

## 2022-04-17 DIAGNOSIS — R12 Heartburn: Secondary | ICD-10-CM

## 2022-04-17 HISTORY — PX: ESOPHAGOGASTRODUODENOSCOPY (EGD) WITH PROPOFOL: SHX5813

## 2022-04-17 LAB — GLUCOSE, CAPILLARY: Glucose-Capillary: 149 mg/dL — ABNORMAL HIGH (ref 70–99)

## 2022-04-17 SURGERY — ESOPHAGOGASTRODUODENOSCOPY (EGD) WITH PROPOFOL
Anesthesia: General

## 2022-04-17 MED ORDER — LACTATED RINGERS IV SOLN: INTRAVENOUS | Status: DC | PRN

## 2022-04-17 MED ORDER — ESOMEPRAZOLE MAGNESIUM 40 MG PO CPDR: 40.0000 mg | DELAYED_RELEASE_CAPSULE | Freq: Two times a day (BID) | ORAL | 3 refills | Status: AC

## 2022-04-17 MED ORDER — LACTATED RINGERS IV SOLN: INTRAVENOUS | Status: DC

## 2022-04-17 MED ORDER — LIDOCAINE HCL (CARDIAC) PF 100 MG/5ML IV SOSY
PREFILLED_SYRINGE | INTRAVENOUS | Status: DC | PRN
  Administered 2022-04-17: 60 mg via INTRATRACHEAL

## 2022-04-17 MED ORDER — PROPOFOL 1000 MG/100ML IV EMUL
INTRAVENOUS | Status: AC
  Filled 2022-04-17: qty 200

## 2022-04-17 MED ORDER — PROPOFOL 10 MG/ML IV BOLUS
INTRAVENOUS | Status: DC | PRN
Start: 2022-04-17 — End: 2022-04-17
  Administered 2022-04-17: 180 mg via INTRAVENOUS

## 2022-04-17 NOTE — Discharge Instructions (Signed)
You are being discharged to home.  ?Resume your previous diet.  ?Start Nexium 40 mg twice a day, stop pantoprazole ?If persistent symptoms may need to do pH impedance testing on PPI ?

## 2022-04-17 NOTE — Anesthesia Preprocedure Evaluation (Signed)
Anesthesia Evaluation  ?Patient identified by MRN, date of birth, ID band ?Patient awake ? ? ? ?Reviewed: ?Allergy & Precautions, NPO status , Patient's Chart, lab work & pertinent test results, reviewed documented beta blocker date and time  ? ?Airway ?Mallampati: II ? ?TM Distance: >3 FB ?Neck ROM: Full ? ? ? Dental ? ?(+) Dental Advisory Given, Chipped,  ?  ?Pulmonary ?asthma ,  ?  ?Pulmonary exam normal ?breath sounds clear to auscultation ? ? ? ? ? ? Cardiovascular ?Exercise Tolerance: Good ?hypertension, Pt. on home beta blockers and Pt. on medications ?+ CAD and + Peripheral Vascular Disease (TAA - 4 CM)  ?Normal cardiovascular exam+ dysrhythmias Supra Ventricular Tachycardia  ?Rhythm:Regular Rate:Normal ? ? ?  ?Neuro/Psych ? Neuromuscular disease negative psych ROS  ? GI/Hepatic ?negative GI ROS, (+) Hepatitis -, C  ?Endo/Other  ?Hypothyroidism  ? Renal/GU ?negative Renal ROS  ?negative genitourinary ?  ?Musculoskeletal ? ?(+) Arthritis , Osteoarthritis,   ? Abdominal ?  ?Peds ?negative pediatric ROS ?(+)  Hematology ?negative hematology ROS ?(+)   ?Anesthesia Other Findings ? ? Reproductive/Obstetrics ?negative OB ROS ? ?  ? ? ? ? ? ? ? ? ? ? ? ? ? ?  ?  ? ? ? ? ? ?Anesthesia Physical ?Anesthesia Plan ? ?ASA: 2 ? ?Anesthesia Plan: General  ? ?Post-op Pain Management: Minimal or no pain anticipated  ? ?Induction: Intravenous ? ?PONV Risk Score and Plan: Propofol infusion ? ?Airway Management Planned: Natural Airway and Nasal Cannula ? ?Additional Equipment:  ? ?Intra-op Plan:  ? ?Post-operative Plan:  ? ?Informed Consent: I have reviewed the patients History and Physical, chart, labs and discussed the procedure including the risks, benefits and alternatives for the proposed anesthesia with the patient or authorized representative who has indicated his/her understanding and acceptance.  ? ? ? ?Dental advisory given ? ?Plan Discussed with: CRNA and Surgeon ? ?Anesthesia Plan  Comments:   ? ? ? ? ?Anesthesia Quick Evaluation ? ?

## 2022-04-17 NOTE — Interval H&P Note (Signed)
History and Physical Interval Note: ? ?04/17/2022 ?12:06 PM ? ?Connie Ho  has presented today for surgery, with the diagnosis of GERD.  The various methods of treatment have been discussed with the patient and family. After consideration of risks, benefits and other options for treatment, the patient has consented to  Procedure(s) with comments: ?ESOPHAGOGASTRODUODENOSCOPY (EGD) WITH PROPOFOL (N/A) - 130 ASA 1 as a surgical intervention.  The patient's history has been reviewed, patient examined, no change in status, stable for surgery.  I have reviewed the patient's chart and labs.  Questions were answered to the patient's satisfaction.   ? ? ?Connie Ho ? ? ?

## 2022-04-17 NOTE — Anesthesia Postprocedure Evaluation (Signed)
Anesthesia Post Note ? ?Patient: Connie Ho ? ?Procedure(s) Performed: ESOPHAGOGASTRODUODENOSCOPY (EGD) WITH PROPOFOL ? ?Patient location during evaluation: Phase II ?Anesthesia Type: General ?Level of consciousness: awake and alert and oriented ?Pain management: pain level controlled ?Vital Signs Assessment: post-procedure vital signs reviewed and stable ?Respiratory status: spontaneous breathing, nonlabored ventilation and respiratory function stable ?Cardiovascular status: blood pressure returned to baseline and stable ?Postop Assessment: no apparent nausea or vomiting ?Anesthetic complications: no ? ? ?No notable events documented. ? ? ?Last Vitals:  ?Vitals:  ? 04/17/22 1215 04/17/22 1331  ?BP: (!) 142/69 120/75  ?Pulse: (!) 54 (!) 58  ?Resp: 13 20  ?Temp: 36.6 ?C 36.5 ?C  ?SpO2: 97% 94%  ?  ?Last Pain:  ?Vitals:  ? 04/17/22 1331  ?TempSrc: Oral  ?PainSc: 0-No pain  ? ? ?  ?  ?  ?  ?  ?  ? ?Letha Mirabal C Hayleigh Bawa ? ? ? ? ?

## 2022-04-17 NOTE — Op Note (Signed)
[facility] ?Patient Name: Connie Ho ?Procedure Date: 04/17/2022 1:11 PM ?MRN: [id number] ?Date of Birth: Mar 03, 1961 ?Attending MD: Maylon Peppers ,  ?CSN: [id number] ?Age: 61 ?Admit Type: Outpatient ?Procedure:                Upper GI endoscopy ?Indications:              Heartburn ?Providers:                Maylon Peppers, Lambert Mody, Crisann  ?                          Wynonia Lawman, Technician ?Referring MD:              ?Medicines:                Monitored Anesthesia Care ?Complications:            No immediate complications. ?Estimated Blood Loss:     Estimated blood loss: none. ?Procedure:                Pre-Anesthesia Assessment: ?                          - Prior to the procedure, a History and Physical  ?                          was performed, and patient medications, allergies  ?                          and sensitivities were reviewed. The patient's  ?                          tolerance of previous anesthesia was reviewed. ?                          - The risks and benefits of the procedure and the  ?                          sedation options and risks were discussed with the  ?                          patient. All questions were answered and informed  ?                          consent was obtained. ?                          - ASA Grade Assessment: II - A patient with mild  ?                          systemic disease. ?                          After obtaining informed consent, the endoscope was  ?                          passed under direct vision. Throughout the  ?  procedure, the patient's blood pressure, pulse, and  ?                          oxygen saturations were monitored continuously. The  ?                          GIF-H190 ([id number]) scope was introduced through the  ?                          mouth, and advanced to the second part of duodenum.  ?                          The upper GI endoscopy was accomplished without  ?                           difficulty. The patient tolerated the procedure  ?                          well. ?Scope In: 1:24:09 PM ?Scope Out: 1:27:41 PM ?Total Procedure Duration: 0 hours 3 minutes 32 seconds  ?Findings: ?     The examined esophagus was normal. ?     The entire examined stomach was normal. ?     The examined duodenum was normal. ?Impression:               - Normal esophagus. ?                          - Normal stomach. ?                          - Normal examined duodenum. ?                          - No specimens collected. ?Moderate Sedation: ?     Per Anesthesia Care ?Recommendation:           - Discharge patient to home (ambulatory). ?                          - Resume previous diet. ?                          - Start Nexium 40 mg twice a day, stop pantoprazole ?                          - If persistent symptoms may need to do pH  ?                          impedance testing on PPI ?Procedure Code(s):        --- Professional --- ?                          [phone], Esophagogastroduodenoscopy, flexible,  ?                          transoral; diagnostic, including collection of  ?  specimen(s) by brushing or washing, when performed  ?                          (separate procedure) ?Diagnosis Code(s):        --- Professional --- ?                          R12, Heartburn ?CPT copyright 2019 American Medical Association. All rights reserved. ?The codes documented in this report are preliminary and upon coder review may  ?be revised to meet current compliance requirements. ?Maylon Peppers, MD ?Maylon Peppers,  ?04/17/2022 1:36:00 PM ?This report has been signed electronically. ?Number of Addenda: 0 ?

## 2022-04-17 NOTE — Transfer of Care (Signed)
Immediate Anesthesia Transfer of Care Note ? ?Patient: Connie Ho ? ?Procedure(s) Performed: ESOPHAGOGASTRODUODENOSCOPY (EGD) WITH PROPOFOL ? ?Patient Location: Short Stay ? ?Anesthesia Type:General ? ?Level of Consciousness: awake, alert  and oriented ? ?Airway & Oxygen Therapy: Patient Spontanous Breathing ? ?Post-op Assessment: Report given to RN and Post -op Vital signs reviewed and stable ? ?Post vital signs: Reviewed and stable ? ?Last Vitals:  ?Vitals Value Taken Time  ?BP    ?Temp    ?Pulse    ?Resp    ?SpO2    ? ? ?Last Pain:  ?Vitals:  ? 04/17/22 1320  ?TempSrc:   ?PainSc: 0-No pain  ?   ? ?  ? ?Complications: No notable events documented. ?

## 2022-04-22 ENCOUNTER — Telehealth (INDEPENDENT_AMBULATORY_CARE_PROVIDER_SITE_OTHER): Payer: Self-pay

## 2022-04-22 NOTE — Telephone Encounter (Signed)
Per [employer] Esomeprazole is not a covered benefit per patient benefit plan. Patient made aware of this and that I spoke with Caryl Pina with [facility] and # 180 capsules out of pocket cost $41.98 and # 30 cost$15.50. I told the patient to contact [facility] and let them know she will be paying out of pocket for the medication . She states understanding. ?

## 2022-04-27 ENCOUNTER — Encounter (HOSPITAL_COMMUNITY): Payer: Self-pay | Admitting: Gastroenterology

## 2022-07-06 ENCOUNTER — Other Ambulatory Visit: Payer: Self-pay | Admitting: *Deleted

## 2022-07-06 DIAGNOSIS — I7121 Aneurysm of the ascending aorta, without rupture: Secondary | ICD-10-CM

## 2022-07-06 DIAGNOSIS — I10 Essential (primary) hypertension: Secondary | ICD-10-CM

## 2022-07-14 ENCOUNTER — Telehealth: Payer: Self-pay | Admitting: Cardiology

## 2022-07-14 NOTE — Telephone Encounter (Signed)
Checking percert on the following patient for testing scheduled at [facility].    CT ANGIO CHEST AORTA W/CM & OR WO/CM  08/21/2022

## 2022-07-30 ENCOUNTER — Ambulatory Visit (INDEPENDENT_AMBULATORY_CARE_PROVIDER_SITE_OTHER): Payer: BC Managed Care – PPO | Admitting: Gastroenterology

## 2022-07-30 ENCOUNTER — Encounter (INDEPENDENT_AMBULATORY_CARE_PROVIDER_SITE_OTHER): Payer: Self-pay | Admitting: Gastroenterology

## 2022-08-21 ENCOUNTER — Ambulatory Visit (HOSPITAL_COMMUNITY)
Admission: RE | Admit: 2022-08-21 | Discharge: 2022-08-21 | Disposition: A | Discharge status: Home / Self Care | Payer: BC Managed Care – PPO | Source: Ambulatory Visit | Attending: Cardiology | Admitting: Cardiology

## 2022-08-21 DIAGNOSIS — I7121 Aneurysm of the ascending aorta, without rupture: Secondary | ICD-10-CM | POA: Insufficient documentation

## 2022-08-21 LAB — POCT I-STAT CREATININE: Creatinine, Ser: 0.8 mg/dL (ref 0.44–1.00)

## 2022-08-21 MED ORDER — IOHEXOL 350 MG/ML SOLN
100.0000 mL | Freq: Once | INTRAVENOUS | Status: AC | PRN
  Administered 2022-08-21: 100 mL via INTRAVENOUS

## 2022-10-06 ENCOUNTER — Ambulatory Visit: Payer: BC Managed Care – PPO | Attending: Cardiology | Admitting: Cardiology

## 2022-10-06 ENCOUNTER — Encounter: Payer: Self-pay | Admitting: Cardiology

## 2022-10-06 VITALS — BP 126/78 | HR 53 | Ht 66.5 in | Wt 207.6 lb

## 2022-10-06 DIAGNOSIS — I7121 Aneurysm of the ascending aorta, without rupture: Secondary | ICD-10-CM

## 2022-10-06 DIAGNOSIS — I10 Essential (primary) hypertension: Secondary | ICD-10-CM

## 2022-10-06 DIAGNOSIS — I471 Supraventricular tachycardia, unspecified: Secondary | ICD-10-CM | POA: Diagnosis not present

## 2022-10-06 NOTE — Patient Instructions (Signed)
Medication Instructions:  Your physician recommends that you continue on your current medications as directed. Please refer to the Current Medication list given to you today.   Labwork: none  Testing/Procedures: Non-Cardiac CT Angiography (CTA), is a special type of CT scan that uses a computer to produce multi-dimensional views of major blood vessels throughout the body. In CT angiography, a contrast material is injected through an IV to help visualize the blood vessels   Follow-Up:  Your physician recommends that you schedule a follow-up appointment in: 1 year. You will receive a call in about 10 months reminding you to schedule your appointment. If you do not receive this call, please contact our office.   Any Other Special Instructions Will Be Listed Below (If Applicable).  If you need a refill on your cardiac medications before your next appointment, please call your pharmacy.

## 2022-10-06 NOTE — Progress Notes (Signed)
Cardiology Office Note  Date: 10/06/2022   ID: Connie Ho, DOB Mar 06, 1961, MRN [id number]  PCP:  Glenda Chroman, MD  Cardiologist:  Rozann Lesches, MD Electrophysiologist:  None   Chief Complaint  Patient presents with   Cardiac follow-up    History of Present Illness: Connie Ho is a 61 y.o. female last seen in August 2022.  She is here for a follow-up visit.  Reports no significant palpitations on current Toprol-XL and Cardizem CD doses.  No dizziness or syncope.  Follow-up chest CTA in September of this year revealed stable 4 cm ascending thoracic aortic aneurysm.  She remains asymptomatic.  I personally reviewed her ECG which shows sinus bradycardia.  We went over her medications.  She continues to follow with Dr. Woody Seller for routine care.  Past Medical History:  Diagnosis Date   Asthma    CAD (coronary artery disease)    Nonobstructive, 09/2011 (false positive GXT)   Chronic hepatitis C without mention of hepatic coma    Degenerative disk disease    Essential hypertension    Hyperlipidemia    Palpitations    Polycythemia vera(238.4)    Postinflammatory pulmonary fibrosis (HCC)    PSVT (paroxysmal supraventricular tachycardia)     Past Surgical History:  Procedure Laterality Date   ABDOMINAL HYSTERECTOMY     Bilateral salpingo-oophorectomy   CARDIAC CATHETERIZATION  2008   mild CAD. 50% diagonal stenosis.    CHOLECYSTECTOMY     post   ESOPHAGOGASTRODUODENOSCOPY (EGD) WITH PROPOFOL N/A 04/17/2022   Procedure: ESOPHAGOGASTRODUODENOSCOPY (EGD) WITH PROPOFOL;  Surgeon: Harvel Quale, MD;  Location: [facility];  Service: Gastroenterology;  Laterality: N/A;  130 ASA 1   SINUS SURGERY WITH INSTATRAK  1995    Current Outpatient Medications  Medication Sig Dispense Refill   albuterol (PROVENTIL) (2.5 MG/3ML) 0.083% nebulizer solution Take 2.5 mg by nebulization every 6 (six) hours as needed for wheezing or shortness of breath.      cyanocobalamin (VITAMIN B12) 1000 MCG/ML injection Inject 1,000 mcg into the muscle once a week.     diltiazem (TIAZAC) 180 MG 24 hr capsule Take 180 mg by mouth 2 (two) times daily.  1   empagliflozin (JARDIANCE) 25 MG TABS tablet Take 25 mg by mouth daily.     esomeprazole (NEXIUM) 40 MG capsule Take 1 capsule (40 mg total) by mouth 2 (two) times daily before a meal. 180 capsule 3   gabapentin (NEURONTIN) 300 MG capsule Take 300 mg by mouth at bedtime as needed (pain).     glipiZIDE (GLUCOTROL) 5 MG tablet Take 5 mg by mouth 2 (two) times daily before a meal.     losartan (COZAAR) 25 MG tablet Take 1 tablet (25 mg total) by mouth daily. 90 tablet 1   metFORMIN (GLUCOPHAGE) 500 MG tablet Take 500 mg by mouth daily with breakfast.     metoprolol succinate (TOPROL-XL) 50 MG 24 hr tablet TAKE ONE TABLET BY MOUTH DAILY. TAKE WITH OR IMMEDIATELY FOLLOWING A MEAL 90 tablet 3   ondansetron (ZOFRAN ODT) 4 MG disintegrating tablet Take 1 tablet (4 mg total) by mouth every 8 (eight) hours as needed for nausea or vomiting. 20 tablet 0   pyridoxine (B-6) 100 MG tablet Take 100 mg by mouth daily.     TRESIBA FLEXTOUCH 100 UNIT/ML FlexTouch Pen SMARTSIG:10 Unit(s) SUB-Q Every Morning     vitamin C (ASCORBIC ACID) 500 MG tablet Take 500 mg by mouth daily.  No current facility-administered medications for this visit.   Allergies:  Peanut-containing drug products, Hydrocodone, Aspirin, Ciprofloxacin, and Ibuprofen   ROS: No orthopnea or PND.  Physical Exam: VS:  BP 126/78   Pulse (!) 53   Ht 5' 6.5" (1.689 m)   Wt 207 lb 9.6 oz (94.2 kg)   SpO2 96%   BMI 33.01 kg/m , BMI Body mass index is 33.01 kg/m.  Wt Readings from Last 3 Encounters:  10/06/22 207 lb 9.6 oz (94.2 kg)  04/17/22 198 lb 6.6 oz (90 kg)  04/01/22 199 lb 12.8 oz (90.6 kg)    General: Patient appears comfortable at rest. HEENT: Conjunctiva and lids normal. Neck: Supple, no elevated JVP or carotid bruits. Lungs: Clear to  auscultation, nonlabored breathing at rest. Cardiac: Regular rate and rhythm, no S3 or significant systolic murmur. Abdomen: Soft, nontender, bowel sounds present. Extremities: No pitting edema.  ECG:  An ECG dated 01/27/2021 was personally reviewed today and demonstrated:  Sinus rhythm with increased voltage.  Recent Labwork: 08/21/2022: Creatinine, Ser 0.80   Other Studies Reviewed Today:  Echocardiogram 06/03/2018:  - Left ventricle: The cavity size was normal. Wall thickness was    increased in a pattern of mild LVH. Systolic function was normal.    The estimated ejection fraction was in the range of 55% to 60%.    Wall motion was normal; there were no regional wall motion    abnormalities. Findings consistent with left ventricular    diastolic dysfunction, grade indeterminate.  - Aortic valve: Trileaflet; mildly thickened leaflets. There was    mild regurgitation.    Cardiac monitor June 2020: Sinus rhythm with frequent PAC's and occasional PVC's. Paroxysms of SVT seen.  Chest CTA 08/21/2022: IMPRESSION: Grossly stable 4.0 cm ascending thoracic aortic aneurysm. Recommend annual imaging followup by CTA or MRA. This recommendation follows 2010 ACCF/AHA/AATS/ACR/ASA/SCA/SCAI/SIR/STS/SVM Guidelines for the Diagnosis and Management of Patients with Thoracic Aortic Disease. Circulation. 2010; 121: F751-W258. Aortic aneurysm NOS (ICD10-I71.9).   Aortic Atherosclerosis (ICD10-I70.0).  Assessment and Plan:  1.  PSVT, doing well in terms of symptom control on Toprol-XL and Cardizem CD.  ECG reviewed today.  No changes made for now, would continue with observation.  2.  Asymptomatic ascending thoracic aortic aneurysm measuring 4 cm by follow-up chest CTA in September of this year.  Reimaging will be obtained in 1 year with clinical follow-up.  3.  Essential hypertension, also on Cozaar.  Blood pressure is well controlled today.  Medication Adjustments/Labs and Tests Ordered: Current  medicines are reviewed at length with the patient today.  Concerns regarding medicines are outlined above.   Tests Ordered: Orders Placed This Encounter  Procedures   CT ANGIO CHEST AORTA W/CM & OR WO/CM   EKG 12-Lead    Medication Changes: No orders of the defined types were placed in this encounter.   Disposition:  Follow up  1 year.  Signed, Satira Sark, MD, [facility] 10/06/2022 4:47 PM    [place] [facility] [place], [place], [place] [address] Phone: [phone]; Fax: [phone]

## 2022-11-09 ENCOUNTER — Other Ambulatory Visit: Payer: Self-pay | Admitting: Internal Medicine

## 2022-11-09 DIAGNOSIS — Z1231 Encounter for screening mammogram for malignant neoplasm of breast: Secondary | ICD-10-CM

## 2022-11-16 ENCOUNTER — Other Ambulatory Visit: Payer: Self-pay | Admitting: Cardiology

## 2022-11-25 ENCOUNTER — Inpatient Hospital Stay: Admission: RE | Admit: 2022-11-25 | Payer: BC Managed Care – PPO | Source: Ambulatory Visit

## 2023-08-04 ENCOUNTER — Ambulatory Visit (HOSPITAL_COMMUNITY): Payer: BC Managed Care – PPO

## 2023-08-30 ENCOUNTER — Telehealth: Payer: Self-pay | Admitting: Cardiology

## 2023-08-30 NOTE — Telephone Encounter (Signed)
Checking percert on the following patient for testing scheduled at [facility]    CT ANGIO CHEST AORTA 09/09/2023

## 2023-09-06 ENCOUNTER — Telehealth: Payer: Self-pay | Admitting: *Deleted

## 2023-09-09 ENCOUNTER — Ambulatory Visit (HOSPITAL_COMMUNITY)
Admission: RE | Admit: 2023-09-09 | Discharge: 2023-09-09 | Disposition: A | Discharge status: Home / Self Care | Payer: BC Managed Care – PPO | Source: Ambulatory Visit | Attending: Cardiology | Admitting: Cardiology

## 2023-09-09 DIAGNOSIS — I7121 Aneurysm of the ascending aorta, without rupture: Secondary | ICD-10-CM | POA: Diagnosis present

## 2023-09-09 LAB — POCT I-STAT CREATININE: Creatinine, Ser: 0.7 mg/dL (ref 0.44–1.00)

## 2023-09-09 MED ORDER — IOHEXOL 350 MG/ML SOLN
100.0000 mL | Freq: Once | INTRAVENOUS | Status: AC | PRN
  Administered 2023-09-09: 100 mL via INTRAVENOUS

## 2023-09-09 NOTE — Telephone Encounter (Signed)
Patient came into office to see why she was called. Advised that lab work was needed prior to CT scan today and that now the CT has been done and lab work was done before CT at hospital. No longer need bmet. Patient informed and verbalized understanding of plan.

## 2023-11-04 ENCOUNTER — Ambulatory Visit: Payer: BC Managed Care – PPO | Admitting: Cardiology

## 2024-01-12 ENCOUNTER — Encounter: Payer: Self-pay | Admitting: Cardiology

## 2024-01-12 ENCOUNTER — Ambulatory Visit: Payer: 59 | Attending: Cardiology | Admitting: Cardiology

## 2024-01-12 VITALS — BP 102/78 | HR 70 | Ht 66.0 in | Wt 196.2 lb

## 2024-01-12 DIAGNOSIS — I7121 Aneurysm of the ascending aorta, without rupture: Secondary | ICD-10-CM | POA: Diagnosis not present

## 2024-01-12 DIAGNOSIS — I10 Essential (primary) hypertension: Secondary | ICD-10-CM

## 2024-01-12 DIAGNOSIS — I77819 Aortic ectasia, unspecified site: Secondary | ICD-10-CM

## 2024-01-12 DIAGNOSIS — E782 Mixed hyperlipidemia: Secondary | ICD-10-CM | POA: Diagnosis not present

## 2024-01-12 DIAGNOSIS — Z01810 Encounter for preprocedural cardiovascular examination: Secondary | ICD-10-CM

## 2024-01-12 DIAGNOSIS — I471 Supraventricular tachycardia, unspecified: Secondary | ICD-10-CM

## 2024-01-12 DIAGNOSIS — M791 Myalgia, unspecified site: Secondary | ICD-10-CM

## 2024-01-12 DIAGNOSIS — I2510 Atherosclerotic heart disease of native coronary artery without angina pectoris: Secondary | ICD-10-CM

## 2024-01-12 DIAGNOSIS — T466X5D Adverse effect of antihyperlipidemic and antiarteriosclerotic drugs, subsequent encounter: Secondary | ICD-10-CM

## 2024-01-12 DIAGNOSIS — T466X5A Adverse effect of antihyperlipidemic and antiarteriosclerotic drugs, initial encounter: Secondary | ICD-10-CM

## 2024-01-12 MED ORDER — ATORVASTATIN CALCIUM 40 MG PO TABS
40.0000 mg | ORAL_TABLET | Freq: Every day | ORAL | 3 refills | Status: DC
Start: 2024-01-12 — End: 2024-04-21

## 2024-01-12 NOTE — Progress Notes (Signed)
    Cardiology Office Note  Date: 01/12/2024   ID: Connie Ho, DOB 1961-09-10, MRN [id number]  History of Present Illness: Connie Ho is a 63 y.o. female last seen in October 2023.  She is here for a follow-up visit.  She has had no significant palpitations in the interim, no exertional chest pain or increasing dyspnea on exertion.  I reviewed her medications.  Current regimen includes diltiazem CD, Cozaar, and Toprol-XL.  Blood pressure is well-controlled today.  I reviewed her ECG today which shows sinus rhythm with prolonged PR interval.  I reviewed her lab work from last year, she has follow-up with Dr. Sherril Croon in the near future.  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  LDL was 189 in February 2024.  She states that she tried Crestor and had myalgias.  We discussed other options and rationale for more aggressive lipid control in the setting of vascular disease.  Physical Exam: VS:  BP 102/78   Pulse 70   Ht 5\' 6"  (1.676 m)   Wt 196 lb 3.2 oz (89 kg)   SpO2 98%   BMI 31.67 kg/m , BMI Body mass index is 31.67 kg/m.  Wt Readings from Last 3 Encounters:  01/12/24 196 lb 3.2 oz (89 kg)  10/06/22 207 lb 9.6 oz (94.2 kg)  04/17/22 198 lb 6.6 oz (90 kg)    General: Patient appears comfortable at rest. HEENT: Conjunctiva and lids normal. Neck: Supple, no elevated JVP or carotid bruits. Lungs: Clear to auscultation, nonlabored breathing at rest. Cardiac: Regular rate and rhythm, no S3 or significant systolic murmur. Extremities: No pitting edema.  ECG:  An ECG dated 10/06/2022 was personally reviewed today and demonstrated:  Sinus bradycardia.  Labwork: February 2024: Cholesterol 268, triglycerides 160, HDL 49, LDL 189, creatinine 0.83, BUN 16, potassium 4, AST 26, ALT 23, hemoglobin 15.8, platelets 291, TSH 1.24 09/09/2023: Creatinine, Ser 0.70   Other Studies Reviewed Today:  No interval cardiac testing for review  today.  Assessment and Plan:  1.  PSVT.  Quiescent on current medical regimen including diltiazem CD and Toprol-XL.  ECG reviewed.  2.  History of mild nonobstructive CAD by cardiac catheterization in 2012.  No angina reported.  Current medical regimen also includes Jardiance.  3.  Asymptomatic ascending aortic dilatation.  This has been stable by serial chest CTA, 4.1 cm by evaluation in September 2024.  Plan to update imaging later this year.  4.  Mixed hyperlipidemia.  History of myalgias on Crestor and likely familial hyperlipidemia with LDL 189 in February of last year, patient states that her sister also has high cholesterol.  We will try Lipitor 40 mg daily.  She will let us know if she experiences any side effects and we can otherwise consider PCSK9 inhibitors.  5.  Primary hypertension.  Blood pressure is well-controlled today.  Continue Cozaar.  Disposition:  Follow up  6 months.  Signed, Jonelle Sidle, M.D., F.A.C.C. [facility]

## 2024-01-12 NOTE — Patient Instructions (Addendum)
Medication Instructions:  Your physician has recommended you make the following change in your medication:  Start taking atorvastatin 40 mg once daily Continue taking all other medications as prescribed  Labwork: BMET to be completed at [facility]  Testing/Procedures: Non-Cardiac CT Angiography (CTA), is a special type of CT scan that uses a computer to produce multi-dimensional views of major blood vessels throughout the body. In CT angiography, a contrast material is injected through an IV to help visualize the blood vessels   Follow-Up: Your physician recommends that you schedule a follow-up appointment in: 9 months  Any Other Special Instructions Will Be Listed Below (If Applicable).  Thank you for choosing [facility]!      If you need a refill on your cardiac medications before your next appointment, please call your pharmacy.

## 2024-01-22 ENCOUNTER — Other Ambulatory Visit: Payer: Self-pay | Admitting: Cardiology

## 2024-01-27 DIAGNOSIS — M7501 Adhesive capsulitis of right shoulder: Secondary | ICD-10-CM | POA: Diagnosis not present

## 2024-02-08 DIAGNOSIS — M25611 Stiffness of right shoulder, not elsewhere classified: Secondary | ICD-10-CM | POA: Diagnosis not present

## 2024-02-08 DIAGNOSIS — M25511 Pain in right shoulder: Secondary | ICD-10-CM | POA: Diagnosis not present

## 2024-02-22 DIAGNOSIS — M25511 Pain in right shoulder: Secondary | ICD-10-CM | POA: Diagnosis not present

## 2024-02-22 DIAGNOSIS — M25611 Stiffness of right shoulder, not elsewhere classified: Secondary | ICD-10-CM | POA: Diagnosis not present

## 2024-02-29 DIAGNOSIS — M25611 Stiffness of right shoulder, not elsewhere classified: Secondary | ICD-10-CM | POA: Diagnosis not present

## 2024-02-29 DIAGNOSIS — M25511 Pain in right shoulder: Secondary | ICD-10-CM | POA: Diagnosis not present

## 2024-03-02 DIAGNOSIS — M25611 Stiffness of right shoulder, not elsewhere classified: Secondary | ICD-10-CM | POA: Diagnosis not present

## 2024-03-02 DIAGNOSIS — M25511 Pain in right shoulder: Secondary | ICD-10-CM | POA: Diagnosis not present

## 2024-03-07 DIAGNOSIS — M25511 Pain in right shoulder: Secondary | ICD-10-CM | POA: Diagnosis not present

## 2024-03-07 DIAGNOSIS — M25611 Stiffness of right shoulder, not elsewhere classified: Secondary | ICD-10-CM | POA: Diagnosis not present

## 2024-03-09 DIAGNOSIS — M7541 Impingement syndrome of right shoulder: Secondary | ICD-10-CM | POA: Diagnosis not present

## 2024-03-15 DIAGNOSIS — M25611 Stiffness of right shoulder, not elsewhere classified: Secondary | ICD-10-CM | POA: Diagnosis not present

## 2024-03-15 DIAGNOSIS — M25511 Pain in right shoulder: Secondary | ICD-10-CM | POA: Diagnosis not present

## 2024-03-17 DIAGNOSIS — M25511 Pain in right shoulder: Secondary | ICD-10-CM | POA: Diagnosis not present

## 2024-03-17 DIAGNOSIS — M25611 Stiffness of right shoulder, not elsewhere classified: Secondary | ICD-10-CM | POA: Diagnosis not present

## 2024-03-23 DIAGNOSIS — M25611 Stiffness of right shoulder, not elsewhere classified: Secondary | ICD-10-CM | POA: Diagnosis not present

## 2024-03-23 DIAGNOSIS — M25511 Pain in right shoulder: Secondary | ICD-10-CM | POA: Diagnosis not present

## 2024-03-28 DIAGNOSIS — M25611 Stiffness of right shoulder, not elsewhere classified: Secondary | ICD-10-CM | POA: Diagnosis not present

## 2024-03-28 DIAGNOSIS — M25511 Pain in right shoulder: Secondary | ICD-10-CM | POA: Diagnosis not present

## 2024-03-30 DIAGNOSIS — M25511 Pain in right shoulder: Secondary | ICD-10-CM | POA: Diagnosis not present

## 2024-03-30 DIAGNOSIS — M25611 Stiffness of right shoulder, not elsewhere classified: Secondary | ICD-10-CM | POA: Diagnosis not present

## 2024-04-04 DIAGNOSIS — M25511 Pain in right shoulder: Secondary | ICD-10-CM | POA: Diagnosis not present

## 2024-04-04 DIAGNOSIS — M25611 Stiffness of right shoulder, not elsewhere classified: Secondary | ICD-10-CM | POA: Diagnosis not present

## 2024-04-06 DIAGNOSIS — M25611 Stiffness of right shoulder, not elsewhere classified: Secondary | ICD-10-CM | POA: Diagnosis not present

## 2024-04-06 DIAGNOSIS — M25511 Pain in right shoulder: Secondary | ICD-10-CM | POA: Diagnosis not present

## 2024-04-11 DIAGNOSIS — M5412 Radiculopathy, cervical region: Secondary | ICD-10-CM | POA: Diagnosis not present

## 2024-04-17 DIAGNOSIS — M545 Low back pain, unspecified: Secondary | ICD-10-CM | POA: Diagnosis not present

## 2024-04-20 DIAGNOSIS — M7541 Impingement syndrome of right shoulder: Secondary | ICD-10-CM | POA: Diagnosis not present

## 2024-04-20 DIAGNOSIS — M5459 Other low back pain: Secondary | ICD-10-CM | POA: Diagnosis not present

## 2024-04-21 ENCOUNTER — Other Ambulatory Visit: Payer: Self-pay | Admitting: Cardiology

## 2024-04-21 DIAGNOSIS — M5416 Radiculopathy, lumbar region: Secondary | ICD-10-CM | POA: Diagnosis not present

## 2024-04-25 DIAGNOSIS — M5412 Radiculopathy, cervical region: Secondary | ICD-10-CM | POA: Diagnosis not present

## 2024-05-03 DIAGNOSIS — M5416 Radiculopathy, lumbar region: Secondary | ICD-10-CM | POA: Diagnosis not present

## 2024-05-16 ENCOUNTER — Telehealth: Payer: Self-pay | Admitting: Cardiology

## 2024-05-16 DIAGNOSIS — M545 Low back pain, unspecified: Secondary | ICD-10-CM | POA: Diagnosis not present

## 2024-05-16 DIAGNOSIS — M5416 Radiculopathy, lumbar region: Secondary | ICD-10-CM | POA: Diagnosis not present

## 2024-05-16 NOTE — Telephone Encounter (Signed)
   Pre-operative Risk Assessment    Patient Name: Connie Ho  DOB: 11/23/1961 MRN: [id number]      Request for Surgical Clearance    Procedure:  LL 2-3 Discectomy/decompression   Date of Surgery:  Clearance TBD                                 Surgeon:  Maurilio Southward MD Surgeon's Group or Practice Name:  [facility] Phone number:  [phone] Fax number:  [phone] PLEASE FAX RECOMMENDATIONS TO [facility].   Type of Clearance Requested:   - Medical    Type of Anesthesia:  Not Indicated   Additional requests/questions:    [name]   05/16/2024, 5:32 PM

## 2024-05-17 ENCOUNTER — Telehealth: Payer: Self-pay

## 2024-05-17 NOTE — Telephone Encounter (Signed)
 Patient agreeable with virtual telehealth appointment. Medication list and consent have both been reviewed. Patient voiced understanding.

## 2024-05-17 NOTE — Telephone Encounter (Signed)
  Patient Consent for Virtual Visit         Connie Ho has provided verbal consent on 05/17/2024 for a virtual visit (video or telephone).   CONSENT FOR VIRTUAL VISIT FOR:  Connie Ho  By participating in this virtual visit I agree to the following:  I hereby voluntarily request, consent and authorize [facility] and its employed or contracted physicians, physician assistants, nurse practitioners or other licensed health care professionals (the Practitioner), to provide me with telemedicine health care services (the "Services") as deemed necessary by the treating Practitioner. I acknowledge and consent to receive the Services by the Practitioner via telemedicine. I understand that the telemedicine visit will involve communicating with the Practitioner through live audiovisual communication technology and the disclosure of certain medical information by electronic transmission. I acknowledge that I have been given the opportunity to request an in-person assessment or other available alternative prior to the telemedicine visit and am voluntarily participating in the telemedicine visit.  I understand that I have the right to withhold or withdraw my consent to the use of telemedicine in the course of my care at any time, without affecting my right to future care or treatment, and that the Practitioner or I may terminate the telemedicine visit at any time. I understand that I have the right to inspect all information obtained and/or recorded in the course of the telemedicine visit and may receive copies of available information for a reasonable fee.  I understand that some of the potential risks of receiving the Services via telemedicine include:  Delay or interruption in medical evaluation due to technological equipment failure or disruption; Information transmitted may not be sufficient (e.g. poor resolution of images) to allow for appropriate medical decision making by the  Practitioner; and/or  In rare instances, security protocols could fail, causing a breach of personal health information.  Furthermore, I acknowledge that it is my responsibility to provide information about my medical history, conditions and care that is complete and accurate to the best of my ability. I acknowledge that Practitioner's advice, recommendations, and/or decision may be based on factors not within their control, such as incomplete or inaccurate data provided by me or distortions of diagnostic images or specimens that may result from electronic transmissions. I understand that the practice of medicine is not an exact science and that Practitioner makes no warranties or guarantees regarding treatment outcomes. I acknowledge that a copy of this consent can be made available to me via my patient portal [facility] MyChart), or I can request a printed copy by calling the office of [facility].    I understand that my insurance will be billed for this visit.   I have read or had this consent read to me. I understand the contents of this consent, which adequately explains the benefits and risks of the Services being provided via telemedicine.  I have been provided ample opportunity to ask questions regarding this consent and the Services and have had my questions answered to my satisfaction. I give my informed consent for the services to be provided through the use of telemedicine in my medical care

## 2024-05-17 NOTE — Telephone Encounter (Signed)
   Name: Connie Ho  DOB: 28-Feb-1961  MRN: [id number]  Primary Cardiologist: Teddie Favre, MD   Preoperative team, please contact this patient and set up a phone call appointment for further preoperative risk assessment. Please obtain consent and complete medication review. Thank you for your help.  I confirm that guidance regarding antiplatelet and oral anticoagulation therapy has been completed and, if necessary, noted below.  None  I also confirmed the patient resides in the state of [place] . As per [facility] Medical Board telemedicine laws, the patient must reside in the state in which the provider is licensed.   Francene Ing, Retha Cast, NP 05/17/2024, 7:25 AM [facility]

## 2024-05-19 DIAGNOSIS — B029 Zoster without complications: Secondary | ICD-10-CM | POA: Diagnosis not present

## 2024-05-19 DIAGNOSIS — R110 Nausea: Secondary | ICD-10-CM | POA: Diagnosis not present

## 2024-05-23 ENCOUNTER — Ambulatory Visit: Attending: Cardiology | Admitting: Emergency Medicine

## 2024-05-23 DIAGNOSIS — Z01810 Encounter for preprocedural cardiovascular examination: Secondary | ICD-10-CM | POA: Diagnosis not present

## 2024-05-23 NOTE — Progress Notes (Signed)
 Virtual Visit via Telephone Note   Because of Connie Ho co-morbid illnesses, she is at least at moderate risk for complications without adequate follow up.  This format is felt to be most appropriate for this patient at this time.  Due to technical limitations with video connection Web designer), today's appointment will be conducted as an audio only telehealth visit, and Connie Ho verbally agreed to proceed in this manner.   All issues noted in this document were discussed and addressed.  No physical exam could be performed with this format.  Evaluation Performed:  Preoperative cardiovascular risk assessment _____________   Date:  05/23/2024   Patient ID:  Connie Ho, DOB 09-27-1961, MRN [id number] Patient Location:  Home Provider location:   Office  Primary Care Provider:  Orlena Bitters, MD Primary Cardiologist:  Connie Favre, MD  Chief Complaint / Patient Profile   63 y.o. y/o female with a h/o PSVT, nonobstructive CAD, ascending aortic dilation, hyperlipidemia, hypertension who is pending LL 2-3 discectomy/decompression with [facility] on date TBD and presents today for telephonic preoperative cardiovascular risk assessment.  History of Present Illness    Connie Ho is a 63 y.o. female who presents via audio/video conferencing for a telehealth visit today.  Pt was last seen in cardiology clinic on 01/12/2024 by Dr. Londa Ho.  At that time Connie Ho was doing well.  The patient is now pending procedure as outlined above. Since her last visit, she denies chest pain, shortness of breath, lower extremity edema, fatigue, palpitations, melena, hematuria, hemoptysis, diaphoresis, weakness, presyncope, syncope, orthopnea, and PND.  Today patient is doing well overall.  She is without any acute cardiovascular concerns or complaints.  She denies any chest pain or exertional symptoms.  She is without any angina, no indication for further ischemic evaluation at  this time.  She tells me she does stay relatively active however this has been severely limited by her lower back and leg pain over the past 5 weeks.  Prior to 5 weeks ago she is very active.  She is able to walk a block, walk up a flight of stairs, walk up a hill without limitation.  She could do housework such as vacuuming, mopping, sweeping without issue.  Prior to her back issues she was going to the gym 3 days a week and denied any exertional symptoms.  Overall she is able to complete greater than 4 METS.  Past Medical History    Past Medical History:  Diagnosis Date   Asthma    CAD (coronary artery disease)    Nonobstructive, 09/2011 (false positive GXT)   Chronic hepatitis C without mention of hepatic coma    Degenerative disk disease    Essential hypertension    Hyperlipidemia    Palpitations    Polycythemia vera(238.4)    Postinflammatory pulmonary fibrosis (HCC)    PSVT (paroxysmal supraventricular tachycardia) (HCC)    Past Surgical History:  Procedure Laterality Date   ABDOMINAL HYSTERECTOMY     Bilateral salpingo-oophorectomy   CARDIAC CATHETERIZATION  2008   mild CAD. 50% diagonal stenosis.    CHOLECYSTECTOMY     post   ESOPHAGOGASTRODUODENOSCOPY (EGD) WITH PROPOFOL  N/A 04/17/2022   Procedure: ESOPHAGOGASTRODUODENOSCOPY (EGD) WITH PROPOFOL ;  Surgeon: Urban Garden, MD;  Location: [facility];  Service: Gastroenterology;  Laterality: N/A;  130 ASA 1   SINUS SURGERY WITH INSTATRAK  1995    Allergies  Allergies  Allergen Reactions   Peanut-Containing Drug Products Hives and Swelling  Stayed in hospital for three days; "swelled from inside out"   Hydrocodone Itching    Itching occurs with benadryl   Aspirin Hives   Ciprofloxacin Itching and Rash    Severe stomach pain    Ibuprofen Hives    Home Medications    Prior to Admission medications   Medication Sig Start Date End Date Taking? Authorizing Provider  albuterol (PROVENTIL) (2.5 MG/3ML)  0.083% nebulizer solution Take 2.5 mg by nebulization every 6 (six) hours as needed for wheezing or shortness of breath.    [provider]  atorvastatin  (LIPITOR) 40 MG tablet TAKE ONE TABLET BY MOUTH DAILY 04/21/24   Gerard Knight, MD  cyanocobalamin (VITAMIN B12) 1000 MCG/ML injection Inject 1,000 mcg into the muscle once a week. 09/24/22   [provider]  diltiazem  (TIAZAC ) 180 MG 24 hr capsule Take 180 mg by mouth 2 (two) times daily. 03/16/18   [provider]  empagliflozin (JARDIANCE) 25 MG TABS tablet Take 25 mg by mouth daily.    [provider]  esomeprazole  (NEXIUM ) 40 MG capsule Take 1 capsule (40 mg total) by mouth 2 (two) times daily before a meal. 04/17/22   Umberto Ganong, Bearl Limes, MD  gabapentin  (NEURONTIN ) 300 MG capsule Take 300 mg by mouth 3 (three) times daily. 06/10/20   [provider]  glipiZIDE (GLUCOTROL) 5 MG tablet Take 5 mg by mouth 2 (two) times daily before a meal. 05/18/20   [provider]  losartan  (COZAAR ) 25 MG tablet Take 1 tablet (25 mg total) by mouth daily. 07/29/20   Gerard Knight, MD  metoprolol  succinate (TOPROL -XL) 50 MG 24 hr tablet TAKE ONE TABLET BY MOUTH EVERY DAY WITH OR IMMEDIATELY FOLLOWING A MEAL 01/24/24   Gerard Knight, MD  ondansetron  (ZOFRAN  ODT) 4 MG disintegrating tablet Take 1 tablet (4 mg total) by mouth every 8 (eight) hours as needed for nausea or vomiting. Patient not taking: Reported on 05/17/2024 04/09/15   Smith, Zachary M, DO  TRULICITY 1.5 MG/0.5ML SOAJ Inject 1.5 mg into the skin once a week. 05/15/24   [provider]    Physical Exam    Vital Signs:  Connie Ho does not have vital signs available for review today.  Given telephonic nature of communication, physical exam is limited. AAOx3. NAD. Normal affect.  Speech and respirations are unlabored.  Accessory Clinical Findings    None  Assessment & Plan    1.  Preoperative Cardiovascular Risk  Assessment: According to the Revised Cardiac Risk Index (RCRI), her Perioperative Risk of Major Cardiac Event is (%): 0.4. Her Functional Capacity in METs is: 5.07 according to the Duke Activity Status Index (DASI). Therefore, based on ACC/AHA guidelines, patient would be at acceptable risk for the planned procedure without further cardiovascular testing.   The patient was advised that if she develops new symptoms prior to surgery to contact our office to arrange for a follow-up visit, and she verbalized understanding.   A copy of this note will be routed to requesting surgeon.  Time:   Today, I have spent 7 minutes with the patient with telehealth technology discussing medical history, symptoms, and management plan.     Ava Boatman, NP  05/23/2024, 9:50 AM

## 2024-05-31 DIAGNOSIS — M5416 Radiculopathy, lumbar region: Secondary | ICD-10-CM | POA: Diagnosis not present

## 2024-06-05 ENCOUNTER — Ambulatory Visit (HOSPITAL_COMMUNITY): Payer: Self-pay | Admitting: Physician Assistant

## 2024-06-06 DIAGNOSIS — R000 Tachycardia, unspecified: Secondary | ICD-10-CM | POA: Diagnosis not present

## 2024-06-06 DIAGNOSIS — I10 Essential (primary) hypertension: Secondary | ICD-10-CM | POA: Diagnosis not present

## 2024-06-06 DIAGNOSIS — R0789 Other chest pain: Secondary | ICD-10-CM | POA: Diagnosis not present

## 2024-06-06 DIAGNOSIS — R55 Syncope and collapse: Secondary | ICD-10-CM | POA: Diagnosis not present

## 2024-06-06 DIAGNOSIS — R42 Dizziness and giddiness: Secondary | ICD-10-CM | POA: Diagnosis not present

## 2024-06-13 NOTE — Pre-Procedure Instructions (Signed)
 Surgical Instructions   Your procedure is scheduled on June 22, 2024. Report to [facility] Main Entrance A at 10:00 A.M., then check in with the Admitting office. Any questions or running late day of surgery: call [phone]  Questions prior to your surgery date: call [phone], Monday-Friday, 8am-4pm. If you experience any cold or flu symptoms such as cough, fever, chills, shortness of breath, etc. between now and your scheduled surgery, please notify us  at the above number.     Remember:  Do not eat after midnight the night before your surgery  You may drink clear liquids until 9:00 AM the morning of your surgery.   Clear liquids allowed are: Water, Non-Citrus Juices (without pulp), Carbonated Beverages, Clear Tea (no milk, honey, etc.), Black Coffee Only (NO MILK, CREAM OR POWDERED CREAMER of any kind), and Gatorade.    Take these medicines the morning of surgery with A SIP OF WATER: atorvastatin  (LIPITOR)  diltiazem  (TIAZAC )  esomeprazole  (NEXIUM )  gabapentin  (NEURONTIN )  metoprolol  succinate (TOPROL -XL)    May take these medicines IF NEEDED: albuterol (PROVENTIL) nebulizer solution  HYDROcodone-acetaminophen (NORCO/VICODIN)  methocarbamol (ROBAXIN)    One week prior to surgery, STOP taking any Aspirin (unless otherwise instructed by your surgeon) Aleve, Naproxen, Ibuprofen, Motrin, Advil, Goody's, BC's, all herbal medications, fish oil, and non-prescription vitamins.   WHAT DO I DO ABOUT MY DIABETES MEDICATION?   Do not take glipiZIDE (GLUCOTROL) the evening before surgery or the morning of surgery.  STOP taking your empagliflozin (JARDIANCE) three days prior to surgery. Your last dose will be July 6th.  STOP taking your TRULICITY one week prior to surgery. Do not take any doses after July 2nd.   HOW TO MANAGE YOUR DIABETES BEFORE AND AFTER SURGERY  Why is it important to control my blood sugar before and after surgery? Improving blood sugar levels before  and after surgery helps healing and can limit problems. A way of improving blood sugar control is eating a healthy diet by:  Eating less sugar and carbohydrates  Increasing activity/exercise  Talking with your doctor about reaching your blood sugar goals High blood sugars (greater than 180 mg/dL) can raise your risk of infections and slow your recovery, so you will need to focus on controlling your diabetes during the weeks before surgery. Make sure that the doctor who takes care of your diabetes knows about your planned surgery including the date and location.  How do I manage my blood sugar before surgery? Check your blood sugar at least 4 times a day, starting 2 days before surgery, to make sure that the level is not too high or low.  Check your blood sugar the morning of your surgery when you wake up and every 2 hours until you get to the Short Stay unit.  If your blood sugar is less than 70 mg/dL, you will need to treat for low blood sugar: Do not take insulin. Treat a low blood sugar (less than 70 mg/dL) with  cup of clear juice (cranberry or apple), 4 glucose tablets, OR glucose gel. Recheck blood sugar in 15 minutes after treatment (to make sure it is greater than 70 mg/dL). If your blood sugar is not greater than 70 mg/dL on recheck, call [phone] for further instructions. Report your blood sugar to the short stay nurse when you get to Short Stay.  If you are admitted to the hospital after surgery: Your blood sugar will be checked by the staff and you will probably be given insulin after surgery (  instead of oral diabetes medicines) to make sure you have good blood sugar levels. The goal for blood sugar control after surgery is 80-180 mg/dL.                      Do NOT Smoke (Tobacco/Vaping) for 24 hours prior to your procedure.  If you use a CPAP at night, you may bring your mask/headgear for your overnight stay.   You will be asked to remove any contacts, glasses,  piercing's, hearing aid's, dentures/partials prior to surgery. Please bring cases for these items if needed.    Patients discharged the day of surgery will not be allowed to drive home, and someone needs to stay with them for 24 hours.  SURGICAL WAITING ROOM VISITATION Patients may have no more than 2 support people in the waiting area - these visitors may rotate.   Pre-op nurse will coordinate an appropriate time for 1 ADULT support person, who may not rotate, to accompany patient in pre-op.  Children under the age of 51 must have an adult with them who is not the patient and must remain in the main waiting area with an adult.  If the patient needs to stay at the hospital during part of their recovery, the visitor guidelines for inpatient rooms apply.  Please refer to the [facility] website for the visitor guidelines for any additional information.   If you received a COVID test during your pre-op visit  it is requested that you wear a mask when out in public, stay away from anyone that may not be feeling well and notify your surgeon if you develop symptoms. If you have been in contact with anyone that has tested positive in the last 10 days please notify you surgeon.      Pre-operative 5 CHG Bathing Instructions   You can play a key role in reducing the risk of infection after surgery. Your skin needs to be as free of germs as possible. You can reduce the number of germs on your skin by washing with CHG (chlorhexidine gluconate) soap before surgery. CHG is an antiseptic soap that kills germs and continues to kill germs even after washing.   DO NOT use if you have an allergy to chlorhexidine/CHG or antibacterial soaps. If your skin becomes reddened or irritated, stop using the CHG and notify one of our RNs at [phone].   Please shower with the CHG soap starting 4 days before surgery using the following schedule:     Please keep in mind the following:  DO NOT shave, including legs  and underarms, starting the day of your first shower.   You may shave your face at any point before/day of surgery.  Place clean sheets on your bed the day you start using CHG soap. Use a clean washcloth (not used since being washed) for each shower. DO NOT sleep with pets once you start using the CHG.   CHG Shower Instructions:  Wash your face and private area with normal soap. If you choose to wash your hair, wash first with your normal shampoo.  After you use shampoo/soap, rinse your hair and body thoroughly to remove shampoo/soap residue.  Turn the water OFF and apply about 3 tablespoons (45 ml) of CHG soap to a CLEAN washcloth.  Apply CHG soap ONLY FROM YOUR NECK DOWN TO YOUR TOES (washing for 3-5 minutes)  DO NOT use CHG soap on face, private areas, open wounds, or sores.  Pay special attention to  the area where your surgery is being performed.  If you are having back surgery, having someone wash your back for you may be helpful. Wait 2 minutes after CHG soap is applied, then you may rinse off the CHG soap.  Pat dry with a clean towel  Put on clean clothes/pajamas   If you choose to wear lotion, please use ONLY the CHG-compatible lotions that are listed below.  Additional instructions for the day of surgery: DO NOT APPLY any lotions, deodorants, cologne, or perfumes.   Do not bring valuables to the hospital. [facility] is not responsible for any belongings/valuables. Do not wear nail polish, gel polish, artificial nails, or any other type of covering on natural nails (fingers and toes) Do not wear jewelry or makeup Put on clean/comfortable clothes.  Please brush your teeth.  Ask your nurse before applying any prescription medications to the skin.     CHG Compatible Lotions   Aveeno Moisturizing lotion  Cetaphil Moisturizing Cream  Cetaphil Moisturizing Lotion  Clairol Herbal Essence Moisturizing Lotion, Dry Skin  Clairol Herbal Essence Moisturizing Lotion, Extra Dry Skin   Clairol Herbal Essence Moisturizing Lotion, Normal Skin  Curel Age Defying Therapeutic Moisturizing Lotion with Alpha Hydroxy  Curel Extreme Care Body Lotion  Curel Soothing Hands Moisturizing Hand Lotion  Curel Therapeutic Moisturizing Cream, Fragrance-Free  Curel Therapeutic Moisturizing Lotion, Fragrance-Free  Curel Therapeutic Moisturizing Lotion, Original Formula  Eucerin Daily Replenishing Lotion  Eucerin Dry Skin Therapy Plus Alpha Hydroxy Crme  Eucerin Dry Skin Therapy Plus Alpha Hydroxy Lotion  Eucerin Original Crme  Eucerin Original Lotion  Eucerin Plus Crme Eucerin Plus Lotion  Eucerin TriLipid Replenishing Lotion  Keri Anti-Bacterial Hand Lotion  Keri Deep Conditioning Original Lotion Dry Skin Formula Softly Scented  Keri Deep Conditioning Original Lotion, Fragrance Free Sensitive Skin Formula  Keri Lotion Fast Absorbing Fragrance Free Sensitive Skin Formula  Keri Lotion Fast Absorbing Softly Scented Dry Skin Formula  Keri Original Lotion  Keri Skin Renewal Lotion Keri Silky Smooth Lotion  Keri Silky Smooth Sensitive Skin Lotion  Nivea Body Creamy Conditioning Oil  Nivea Body Extra Enriched Lotion  Nivea Body Original Lotion  Nivea Body Sheer Moisturizing Lotion Nivea Crme  Nivea Skin Firming Lotion  NutraDerm 30 Skin Lotion  NutraDerm Skin Lotion  NutraDerm Therapeutic Skin Cream  NutraDerm Therapeutic Skin Lotion  ProShield Protective Hand Cream  Provon moisturizing lotion  Please read over the following fact sheets that you were given.

## 2024-06-14 ENCOUNTER — Other Ambulatory Visit: Payer: Self-pay

## 2024-06-14 ENCOUNTER — Encounter (HOSPITAL_COMMUNITY): Payer: Self-pay

## 2024-06-14 ENCOUNTER — Encounter (HOSPITAL_COMMUNITY)
Admission: RE | Admit: 2024-06-14 | Discharge: 2024-06-14 | Disposition: A | Discharge status: Home / Self Care | Source: Ambulatory Visit | Attending: Orthopedic Surgery | Admitting: Orthopedic Surgery

## 2024-06-14 VITALS — BP 117/69 | HR 58 | Temp 98.4°F | Resp 16 | Ht 66.0 in | Wt 199.8 lb

## 2024-06-14 DIAGNOSIS — K769 Liver disease, unspecified: Secondary | ICD-10-CM | POA: Diagnosis not present

## 2024-06-14 DIAGNOSIS — I10 Essential (primary) hypertension: Secondary | ICD-10-CM | POA: Diagnosis not present

## 2024-06-14 DIAGNOSIS — E119 Type 2 diabetes mellitus without complications: Secondary | ICD-10-CM | POA: Insufficient documentation

## 2024-06-14 DIAGNOSIS — Z79899 Other long term (current) drug therapy: Secondary | ICD-10-CM | POA: Diagnosis not present

## 2024-06-14 DIAGNOSIS — K219 Gastro-esophageal reflux disease without esophagitis: Secondary | ICD-10-CM | POA: Insufficient documentation

## 2024-06-14 DIAGNOSIS — E785 Hyperlipidemia, unspecified: Secondary | ICD-10-CM | POA: Diagnosis not present

## 2024-06-14 DIAGNOSIS — Z7984 Long term (current) use of oral hypoglycemic drugs: Secondary | ICD-10-CM | POA: Insufficient documentation

## 2024-06-14 DIAGNOSIS — I2510 Atherosclerotic heart disease of native coronary artery without angina pectoris: Secondary | ICD-10-CM | POA: Diagnosis not present

## 2024-06-14 DIAGNOSIS — Z01812 Encounter for preprocedural laboratory examination: Secondary | ICD-10-CM | POA: Diagnosis present

## 2024-06-14 DIAGNOSIS — Z01818 Encounter for other preprocedural examination: Secondary | ICD-10-CM

## 2024-06-14 HISTORY — DX: Peripheral vascular disease, unspecified: I73.9

## 2024-06-14 HISTORY — DX: Cardiac arrhythmia, unspecified: I49.9

## 2024-06-14 HISTORY — DX: Type 2 diabetes mellitus without complications: E11.9

## 2024-06-14 HISTORY — DX: Headache, unspecified: R51.9

## 2024-06-14 HISTORY — DX: Pneumonia, unspecified organism: J18.9

## 2024-06-14 HISTORY — DX: Gastro-esophageal reflux disease without esophagitis: K21.9

## 2024-06-14 LAB — COMPREHENSIVE METABOLIC PANEL WITH GFR
ALT: 59 U/L — ABNORMAL HIGH (ref 0–44)
AST: 37 U/L (ref 15–41)
Albumin: 3.9 g/dL (ref 3.5–5.0)
Alkaline Phosphatase: 82 U/L (ref 38–126)
Anion gap: 10 (ref 5–15)
BUN: 9 mg/dL (ref 8–23)
CO2: 26 mmol/L (ref 22–32)
Calcium: 10.1 mg/dL (ref 8.9–10.3)
Chloride: 104 mmol/L (ref 98–111)
Creatinine, Ser: 0.68 mg/dL (ref 0.44–1.00)
GFR, Estimated: >60 mL/min (ref >60)
Glucose, Bld: 163 mg/dL — ABNORMAL HIGH (ref 70–99)
Potassium: 4 mmol/L (ref 3.5–5.1)
Sodium: 140 mmol/L (ref 135–145)
Total Bilirubin: 1 mg/dL (ref 0.0–1.2)
Total Protein: 7.4 g/dL (ref 6.5–8.1)

## 2024-06-14 LAB — CBC
HCT: 45.2 % (ref 36.0–46.0)
Hemoglobin: 15.6 g/dL — ABNORMAL HIGH (ref 12.0–15.0)
MCH: 32.1 pg (ref 26.0–34.0)
MCHC: 34.5 g/dL (ref 30.0–36.0)
MCV: 93 fL (ref 80.0–100.0)
Platelets: 272 10*3/uL (ref 150–400)
RBC: 4.86 MIL/uL (ref 3.87–5.11)
RDW: 13.4 % (ref 11.5–15.5)
WBC: 9.4 10*3/uL (ref 4.0–10.5)
nRBC: 0 % (ref 0.0–0.2)

## 2024-06-14 LAB — SURGICAL PCR SCREEN
MRSA, PCR: NEGATIVE
Staphylococcus aureus: NEGATIVE

## 2024-06-14 LAB — GLUCOSE, CAPILLARY: Glucose-Capillary: 158 mg/dL — ABNORMAL HIGH (ref 70–99)

## 2024-06-14 NOTE — Progress Notes (Signed)
 PCP - Dr. Ervin Fear Cardiologist - Dr. Jayson Sierras - last office visit 01/12/2024  PPM/ICD - Denies Device Orders - n/a Rep Notified - n/a  Chest x-ray - n/a  EKG - 01/12/2024 Stress Test - 09/28/2011 ECHO - 06/03/2018 Cardiac Cath - 2008 with no intervention  Sleep Study - Denies CPAP - n/a  Pt is DM2. She checks her blood sugar at least every other day, if not more. Normal fasting range is 130-200. CBG at pre-op appointment 158. Per pt, last A1c was 8.4 (result requested from PCP). States she received prednisone  injections and pills which increased her A1c and Dr. Burnetta is aware.  Last dose of GLP1 agonist- Pt has not had Trulicity in over one week due to running out of medication. GLP1 instructions: Pt instructed to not take any doses prior to surgery  Blood Thinner Instructions: n/a Aspirin Instructions: n/a  ERAS Protcol - Clear liquids until 0900 morning of surgery PRE-SURGERY Ensure or G2- n/a  COVID TEST- n/a   Anesthesia review: Yes. Cardiac clearance and A1c result requested from PCP. To note, [place] the beach ([place]ed Sunday, [place]th) and was taken to the ED for dizziness/weakness. She was dehydrated and received 3 bags of fluid. Once completed, she felt back to baseline and was released. Pt not aware of the name of hospital.  Patient denies shortness of breath, fever, cough and chest pain at PAT appointment. Pt denies any respiratory illness/infection in the last two months.   All instructions explained to the patient, with a verbal understanding of the material. Patient agrees to go over the instructions while at home for a better understanding. Patient also instructed to self quarantine after being tested for COVID-19. The opportunity to ask questions was provided.

## 2024-06-15 NOTE — Anesthesia Preprocedure Evaluation (Addendum)
 Anesthesia Evaluation  Patient identified by MRN, date of birth, ID band Patient awake    Reviewed: Allergy & Precautions, H&P , NPO status , Patient's Chart, lab work & pertinent test results  Airway Mallampati: II   Neck ROM: full    Dental   Pulmonary asthma    breath sounds clear to auscultation       Cardiovascular hypertension, + CAD and + Peripheral Vascular Disease  + dysrhythmias Supra Ventricular Tachycardia  Rhythm:regular Rate:Normal     Neuro/Psych  Headaches  Neuromuscular disease    GI/Hepatic ,GERD  ,,  Endo/Other  diabetes, Type 2Hypothyroidism    Renal/GU      Musculoskeletal  (+) Arthritis ,    Abdominal   Peds  Hematology   Anesthesia Other Findings   Reproductive/Obstetrics                              Anesthesia Physical Anesthesia Plan  ASA: 3  Anesthesia Plan: General   Post-op Pain Management:    Induction: Intravenous  PONV Risk Score and Plan: 3 and Ondansetron , Dexamethasone , Midazolam  and Treatment may vary due to age or medical condition  Airway Management Planned: Oral ETT  Additional Equipment:   Intra-op Plan:   Post-operative Plan: Extubation in OR  Informed Consent: I have reviewed the patients History and Physical, chart, labs and discussed the procedure including the risks, benefits and alternatives for the proposed anesthesia with the patient or authorized representative who has indicated his/her understanding and acceptance.     Dental advisory given  Plan Discussed with: CRNA, Anesthesiologist and Surgeon  Anesthesia Plan Comments: (PAT note by Lynwood Hope, PA-C: 63 year old female follows with cardiology for history of PSVT, nonobstructive CAD by cath 2012, ascending aortic dilation (stable 4.1 cm by CTA 08/2023), HLD, HTN.  Seen by Lum Louis, NP on [phone] for preop evaluation.  Per note, ccording to the Revised Cardiac Risk  Index (RCRI), her Perioperative Risk of Major Cardiac Event is (%): 0.4. Her Functional Capacity in METs is: 5.07 according to the Duke Activity Status Index (DASI). Therefore, based on ACC/AHA guidelines, patient would be at acceptable risk for the planned procedure without further cardiovascular testing.  Other pertinent history includes GERD on PPI, non-insulin -dependent DM2. Patient reports most recent A1c 8.4, requested from PCP.  Reports last dose of Trulicity several weeks ago due to running out of medication.  Preop labs reviewed, glucose elevated 163, ALT mildly elevated at 59, otherwise unremarkable.  EKG 01/12/2024: Sinus rhythm with 1st degree A-V block.  Rate 65.  TTE 06/03/2018: - Left ventricle: The cavity size was normal. Wall thickness was  increased in a pattern of mild LVH. Systolic function was normal.  The estimated ejection fraction was in the range of 55% to 60%.  Wall motion was normal; there were no regional wall motion  abnormalities. Findings consistent with left ventricular  diastolic dysfunction, grade indeterminate.  - Aortic valve: Trileaflet; mildly thickened leaflets. There was  mild regurgitation.   )         Anesthesia Quick Evaluation

## 2024-06-15 NOTE — Progress Notes (Signed)
 Anesthesia Chart Review:  63 year old female follows with cardiology for history of PSVT, nonobstructive CAD by cath 2012, ascending aortic dilation (stable 4.1 cm by CTA 08/2023), HLD, HTN.  Seen by Lum Louis, NP on [phone] for preop evaluation.  Per note, ccording to the Revised Cardiac Risk Index (RCRI), her Perioperative Risk of Major Cardiac Event is (%): 0.4. Her Functional Capacity in METs is: 5.07 according to the Duke Activity Status Index (DASI). Therefore, based on ACC/AHA guidelines, patient would be at acceptable risk for the planned procedure without further cardiovascular testing.  Other pertinent history includes GERD on PPI, non-insulin-dependent DM2. Patient reports most recent A1c 8.4, requested from PCP.  Reports last dose of Trulicity several weeks ago due to running out of medication.  Preop labs reviewed, glucose elevated 163, ALT mildly elevated at 59, otherwise unremarkable.  EKG 01/12/2024: Sinus rhythm with 1st degree A-V block.  Rate 65.  TTE 06/03/2018: - Left ventricle: The cavity size was normal. Wall thickness was    increased in a pattern of mild LVH. Systolic function was normal.    The estimated ejection fraction was in the range of 55% to 60%.    Wall motion was normal; there were no regional wall motion    abnormalities. Findings consistent with left ventricular    diastolic dysfunction, grade indeterminate.  - Aortic valve: Trileaflet; mildly thickened leaflets. There was    mild regurgitation.      Lynwood Geofm RIGGERS [facility]/Anesthesiology Phone [phone] 06/15/2024 3:51 PM

## 2024-06-22 ENCOUNTER — Other Ambulatory Visit: Payer: Self-pay

## 2024-06-22 ENCOUNTER — Encounter (HOSPITAL_COMMUNITY)
Admission: RE | Disposition: A | Discharge status: Home / Self Care | Payer: Self-pay | Source: Home / Self Care | Attending: Orthopedic Surgery

## 2024-06-22 ENCOUNTER — Observation Stay (HOSPITAL_COMMUNITY)
Admission: RE | Admit: 2024-06-22 | Discharge: 2024-06-23 | Disposition: A | Discharge status: Home / Self Care | Attending: Orthopedic Surgery | Admitting: Orthopedic Surgery

## 2024-06-22 ENCOUNTER — Encounter (HOSPITAL_COMMUNITY): Payer: Self-pay | Admitting: Orthopedic Surgery

## 2024-06-22 ENCOUNTER — Ambulatory Visit (HOSPITAL_COMMUNITY): Payer: Self-pay | Admitting: Physician Assistant

## 2024-06-22 ENCOUNTER — Ambulatory Visit (HOSPITAL_COMMUNITY)

## 2024-06-22 ENCOUNTER — Ambulatory Visit (HOSPITAL_COMMUNITY): Admitting: Anesthesiology

## 2024-06-22 DIAGNOSIS — Z7984 Long term (current) use of oral hypoglycemic drugs: Secondary | ICD-10-CM | POA: Diagnosis not present

## 2024-06-22 DIAGNOSIS — I10 Essential (primary) hypertension: Secondary | ICD-10-CM

## 2024-06-22 DIAGNOSIS — E039 Hypothyroidism, unspecified: Secondary | ICD-10-CM | POA: Diagnosis not present

## 2024-06-22 DIAGNOSIS — Z981 Arthrodesis status: Secondary | ICD-10-CM | POA: Diagnosis not present

## 2024-06-22 DIAGNOSIS — M5116 Intervertebral disc disorders with radiculopathy, lumbar region: Secondary | ICD-10-CM | POA: Diagnosis not present

## 2024-06-22 DIAGNOSIS — Z79899 Other long term (current) drug therapy: Secondary | ICD-10-CM | POA: Diagnosis not present

## 2024-06-22 DIAGNOSIS — J45909 Unspecified asthma, uncomplicated: Secondary | ICD-10-CM | POA: Insufficient documentation

## 2024-06-22 DIAGNOSIS — Z7985 Long-term (current) use of injectable non-insulin antidiabetic drugs: Secondary | ICD-10-CM | POA: Diagnosis not present

## 2024-06-22 DIAGNOSIS — E119 Type 2 diabetes mellitus without complications: Secondary | ICD-10-CM | POA: Insufficient documentation

## 2024-06-22 DIAGNOSIS — M5126 Other intervertebral disc displacement, lumbar region: Principal | ICD-10-CM | POA: Diagnosis present

## 2024-06-22 DIAGNOSIS — I2510 Atherosclerotic heart disease of native coronary artery without angina pectoris: Secondary | ICD-10-CM | POA: Insufficient documentation

## 2024-06-22 HISTORY — PX: LUMBAR LAMINECTOMY/DECOMPRESSION WITH DISCECTOMY: SHX7439

## 2024-06-22 LAB — GLUCOSE, CAPILLARY
Glucose-Capillary: 156 mg/dL — ABNORMAL HIGH (ref 70–99)
Glucose-Capillary: 146 mg/dL — ABNORMAL HIGH (ref 70–99)
Glucose-Capillary: 197 mg/dL — ABNORMAL HIGH (ref 70–99)
Glucose-Capillary: 259 mg/dL — ABNORMAL HIGH (ref 70–99)

## 2024-06-22 LAB — HEMOGLOBIN A1C
Hgb A1c MFr Bld: 7.6 % — ABNORMAL HIGH (ref 4.8–5.6)
Mean Plasma Glucose: 171 mg/dL

## 2024-06-22 SURGERY — LUMBAR LAMINECTOMY/DECOMPRESSION WITH DISCECTOMY
Anesthesia: General | Laterality: Left

## 2024-06-22 MED ORDER — INSULIN ASPART 100 UNIT/ML IJ SOLN
0.0000 [IU] | Freq: Every day | INTRAMUSCULAR | Status: DC
  Administered 2024-06-22: 3 [IU] via SUBCUTANEOUS

## 2024-06-22 MED ORDER — METOPROLOL SUCCINATE ER 50 MG PO TB24: 50.0000 mg | ORAL_TABLET | Freq: Every day | ORAL | Status: DC

## 2024-06-22 MED ORDER — DILTIAZEM HCL ER COATED BEADS 180 MG PO CP24
180.0000 mg | ORAL_CAPSULE | Freq: Two times a day (BID) | ORAL | Status: DC
  Administered 2024-06-22: 180 mg via ORAL
  Filled 2024-06-22 (×3): qty 1

## 2024-06-22 MED ORDER — SODIUM CHLORIDE 0.9 % IV SOLN: 250.0000 mL | INTRAVENOUS | Status: DC

## 2024-06-22 MED ORDER — METHOCARBAMOL 500 MG PO TABS: 500.0000 mg | ORAL_TABLET | Freq: Three times a day (TID) | ORAL | 0 refills | Status: AC | PRN

## 2024-06-22 MED ORDER — FENTANYL CITRATE (PF) 250 MCG/5ML IJ SOLN
INTRAMUSCULAR | Status: AC
  Filled 2024-06-22: qty 5

## 2024-06-22 MED ORDER — ACETAMINOPHEN 325 MG PO TABS: 650.0000 mg | ORAL_TABLET | ORAL | Status: DC | PRN

## 2024-06-22 MED ORDER — INSULIN ASPART 100 UNIT/ML IJ SOLN
0.0000 [IU] | Freq: Three times a day (TID) | INTRAMUSCULAR | Status: DC
  Administered 2024-06-23: 3 [IU] via SUBCUTANEOUS

## 2024-06-22 MED ORDER — GLIPIZIDE 5 MG PO TABS
5.0000 mg | ORAL_TABLET | Freq: Two times a day (BID) | ORAL | Status: DC
  Administered 2024-06-23: 5 mg via ORAL
  Filled 2024-06-22: qty 1

## 2024-06-22 MED ORDER — ONDANSETRON HCL 4 MG/2ML IJ SOLN
INTRAMUSCULAR | Status: DC | PRN
  Administered 2024-06-22: 4 mg via INTRAVENOUS

## 2024-06-22 MED ORDER — ONDANSETRON HCL 4 MG/2ML IJ SOLN
4.0000 mg | Freq: Four times a day (QID) | INTRAMUSCULAR | Status: DC | PRN
Start: 2024-06-22 — End: 2024-06-23
  Administered 2024-06-22: 4 mg via INTRAVENOUS
  Filled 2024-06-22: qty 2

## 2024-06-22 MED ORDER — CEFAZOLIN SODIUM-DEXTROSE 2-4 GM/100ML-% IV SOLN
2.0000 g | INTRAVENOUS | Status: AC
  Administered 2024-06-22: 2 g via INTRAVENOUS

## 2024-06-22 MED ORDER — OXYCODONE HCL 5 MG PO TABS: 5.0000 mg | ORAL_TABLET | Freq: Once | ORAL | Status: DC | PRN

## 2024-06-22 MED ORDER — DEXAMETHASONE SODIUM PHOSPHATE 10 MG/ML IJ SOLN
INTRAMUSCULAR | Status: DC | PRN
  Administered 2024-06-22: 5 mg via INTRAVENOUS

## 2024-06-22 MED ORDER — LIDOCAINE 2% (20 MG/ML) 5 ML SYRINGE
INTRAMUSCULAR | Status: DC | PRN
  Administered 2024-06-22: 60 mg via INTRAVENOUS

## 2024-06-22 MED ORDER — MENTHOL 3 MG MT LOZG: 1.0000 | LOZENGE | OROMUCOSAL | Status: DC | PRN

## 2024-06-22 MED ORDER — PROPOFOL 10 MG/ML IV BOLUS
INTRAVENOUS | Status: DC | PRN
  Administered 2024-06-22: 150 mg via INTRAVENOUS

## 2024-06-22 MED ORDER — SUGAMMADEX SODIUM 200 MG/2ML IV SOLN
INTRAVENOUS | Status: AC
  Filled 2024-06-22: qty 2

## 2024-06-22 MED ORDER — 0.9 % SODIUM CHLORIDE (POUR BTL) OPTIME
TOPICAL | Status: DC | PRN
  Administered 2024-06-22: 1000 mL

## 2024-06-22 MED ORDER — METHYLPREDNISOLONE ACETATE 40 MG/ML IJ SUSP
INTRAMUSCULAR | Status: AC
Start: 2024-06-22 — End: 2024-06-22
  Filled 2024-06-22: qty 1

## 2024-06-22 MED ORDER — SODIUM CHLORIDE 0.9% FLUSH: 3.0000 mL | INTRAVENOUS | Status: DC | PRN

## 2024-06-22 MED ORDER — LACTATED RINGERS IV SOLN: INTRAVENOUS | Status: DC

## 2024-06-22 MED ORDER — LOSARTAN POTASSIUM 50 MG PO TABS
50.0000 mg | ORAL_TABLET | Freq: Every day | ORAL | Status: DC
  Administered 2024-06-22: 50 mg via ORAL
  Filled 2024-06-22: qty 1

## 2024-06-22 MED ORDER — THROMBIN 20000 UNITS EX SOLR
CUTANEOUS | Status: AC
  Filled 2024-06-22: qty 20000

## 2024-06-22 MED ORDER — DILTIAZEM HCL ER COATED BEADS 180 MG PO CP24
180.0000 mg | ORAL_CAPSULE | Freq: Every day | ORAL | Status: DC
  Filled 2024-06-22: qty 1

## 2024-06-22 MED ORDER — ONDANSETRON HCL 4 MG/2ML IJ SOLN: 4.0000 mg | Freq: Four times a day (QID) | INTRAMUSCULAR | Status: DC | PRN

## 2024-06-22 MED ORDER — OXYCODONE HCL 5 MG PO TABS
10.0000 mg | ORAL_TABLET | ORAL | Status: DC | PRN
  Administered 2024-06-22 – 2024-06-23 (×3): 10 mg via ORAL
  Filled 2024-06-22 (×3): qty 2

## 2024-06-22 MED ORDER — LIDOCAINE 2% (20 MG/ML) 5 ML SYRINGE
INTRAMUSCULAR | Status: AC
  Filled 2024-06-22: qty 5

## 2024-06-22 MED ORDER — OXYCODONE HCL 5 MG PO TABS: 5.0000 mg | ORAL_TABLET | ORAL | Status: DC | PRN

## 2024-06-22 MED ORDER — ORAL CARE MOUTH RINSE: 15.0000 mL | Freq: Once | OROMUCOSAL | Status: AC

## 2024-06-22 MED ORDER — PROPOFOL 10 MG/ML IV BOLUS
INTRAVENOUS | Status: AC
  Filled 2024-06-22: qty 20

## 2024-06-22 MED ORDER — TRANEXAMIC ACID-NACL 1000-0.7 MG/100ML-% IV SOLN
1000.0000 mg | INTRAVENOUS | Status: AC
  Administered 2024-06-22: 1000 mg via INTRAVENOUS

## 2024-06-22 MED ORDER — TRANEXAMIC ACID-NACL 1000-0.7 MG/100ML-% IV SOLN
INTRAVENOUS | Status: AC
  Filled 2024-06-22: qty 100

## 2024-06-22 MED ORDER — CHLORHEXIDINE GLUCONATE 0.12 % MT SOLN
OROMUCOSAL | Status: AC
  Administered 2024-06-22: 15 mL via OROMUCOSAL
  Filled 2024-06-22: qty 15

## 2024-06-22 MED ORDER — SUGAMMADEX SODIUM 200 MG/2ML IV SOLN
INTRAVENOUS | Status: DC | PRN
  Administered 2024-06-22: 180 mg via INTRAVENOUS

## 2024-06-22 MED ORDER — ROCURONIUM BROMIDE 10 MG/ML (PF) SYRINGE
PREFILLED_SYRINGE | INTRAVENOUS | Status: AC
  Filled 2024-06-22: qty 10

## 2024-06-22 MED ORDER — CEFAZOLIN SODIUM-DEXTROSE 2-4 GM/100ML-% IV SOLN
INTRAVENOUS | Status: AC
  Filled 2024-06-22: qty 100

## 2024-06-22 MED ORDER — ONDANSETRON HCL 4 MG PO TABS
4.0000 mg | ORAL_TABLET | Freq: Four times a day (QID) | ORAL | Status: DC | PRN
  Administered 2024-06-22: 4 mg via ORAL
  Filled 2024-06-22: qty 1

## 2024-06-22 MED ORDER — DILTIAZEM HCL ER BEADS 180 MG PO CP24: 180.0000 mg | ORAL_CAPSULE | Freq: Two times a day (BID) | ORAL | Status: DC

## 2024-06-22 MED ORDER — ACETAMINOPHEN 10 MG/ML IV SOLN
INTRAVENOUS | Status: DC | PRN
  Administered 2024-06-22: 1000 mg via INTRAVENOUS

## 2024-06-22 MED ORDER — CEFAZOLIN SODIUM-DEXTROSE 1-4 GM/50ML-% IV SOLN
1.0000 g | Freq: Three times a day (TID) | INTRAVENOUS | Status: AC
  Administered 2024-06-22 – 2024-06-23 (×2): 1 g via INTRAVENOUS
  Filled 2024-06-22 (×2): qty 50

## 2024-06-22 MED ORDER — GABAPENTIN 300 MG PO CAPS
300.0000 mg | ORAL_CAPSULE | Freq: Three times a day (TID) | ORAL | Status: DC
  Administered 2024-06-22: 300 mg via ORAL
  Filled 2024-06-22: qty 1

## 2024-06-22 MED ORDER — MIDAZOLAM HCL 2 MG/2ML IJ SOLN
INTRAMUSCULAR | Status: DC | PRN
  Administered 2024-06-22: 2 mg via INTRAVENOUS

## 2024-06-22 MED ORDER — FENTANYL CITRATE (PF) 100 MCG/2ML IJ SOLN
25.0000 ug | INTRAMUSCULAR | Status: DC | PRN
  Administered 2024-06-22 (×2): 50 ug via INTRAVENOUS

## 2024-06-22 MED ORDER — POLYETHYLENE GLYCOL 3350 17 G PO PACK
17.0000 g | PACK | Freq: Every day | ORAL | Status: DC | PRN
  Administered 2024-06-22: 17 g via ORAL
  Filled 2024-06-22: qty 1

## 2024-06-22 MED ORDER — MAGNESIUM CITRATE PO SOLN: 1.0000 | Freq: Once | ORAL | Status: DC | PRN

## 2024-06-22 MED ORDER — ROCURONIUM BROMIDE 10 MG/ML (PF) SYRINGE
PREFILLED_SYRINGE | INTRAVENOUS | Status: DC | PRN
  Administered 2024-06-22: 60 mg via INTRAVENOUS
  Administered 2024-06-22 (×2): 10 mg via INTRAVENOUS

## 2024-06-22 MED ORDER — METHOCARBAMOL 500 MG PO TABS
500.0000 mg | ORAL_TABLET | Freq: Four times a day (QID) | ORAL | Status: DC | PRN
  Administered 2024-06-22: 500 mg via ORAL
  Filled 2024-06-22: qty 1

## 2024-06-22 MED ORDER — SODIUM CHLORIDE 0.9% FLUSH
3.0000 mL | Freq: Two times a day (BID) | INTRAVENOUS | Status: DC
  Administered 2024-06-22: 3 mL via INTRAVENOUS

## 2024-06-22 MED ORDER — HYDROMORPHONE HCL 1 MG/ML IJ SOLN
INTRAMUSCULAR | Status: AC
  Filled 2024-06-22: qty 0.5

## 2024-06-22 MED ORDER — OXYCODONE-ACETAMINOPHEN 10-325 MG PO TABS: 1.0000 | ORAL_TABLET | Freq: Four times a day (QID) | ORAL | 0 refills | Status: AC | PRN

## 2024-06-22 MED ORDER — DIPHENHYDRAMINE HCL 25 MG PO CAPS
25.0000 mg | ORAL_CAPSULE | Freq: Four times a day (QID) | ORAL | Status: DC | PRN
  Administered 2024-06-22: 25 mg via ORAL
  Filled 2024-06-22: qty 1

## 2024-06-22 MED ORDER — PHENOL 1.4 % MT LIQD: 1.0000 | OROMUCOSAL | Status: DC | PRN

## 2024-06-22 MED ORDER — METHOCARBAMOL 1000 MG/10ML IJ SOLN: 500.0000 mg | Freq: Four times a day (QID) | INTRAMUSCULAR | Status: DC | PRN

## 2024-06-22 MED ORDER — MIDAZOLAM HCL 2 MG/2ML IJ SOLN
INTRAMUSCULAR | Status: AC
  Filled 2024-06-22: qty 2

## 2024-06-22 MED ORDER — BUPIVACAINE-EPINEPHRINE (PF) 0.25% -1:200000 IJ SOLN
INTRAMUSCULAR | Status: AC
  Filled 2024-06-22: qty 30

## 2024-06-22 MED ORDER — CHLORHEXIDINE GLUCONATE 0.12 % MT SOLN
15.0000 mL | Freq: Once | OROMUCOSAL | Status: AC
Start: 2024-06-22 — End: 2024-06-22

## 2024-06-22 MED ORDER — ACETAMINOPHEN 650 MG RE SUPP: 650.0000 mg | RECTAL | Status: DC | PRN

## 2024-06-22 MED ORDER — ONDANSETRON HCL 4 MG PO TABS: 4.0000 mg | ORAL_TABLET | Freq: Three times a day (TID) | ORAL | 0 refills | Status: AC | PRN

## 2024-06-22 MED ORDER — SURGIFLO WITH THROMBIN (HEMOSTATIC MATRIX KIT) OPTIME
TOPICAL | Status: DC | PRN
  Administered 2024-06-22: 1

## 2024-06-22 MED ORDER — HYDROMORPHONE HCL 1 MG/ML IJ SOLN
INTRAMUSCULAR | Status: DC | PRN
  Administered 2024-06-22: .25 mg via INTRAVENOUS

## 2024-06-22 MED ORDER — INSULIN ASPART 100 UNIT/ML IJ SOLN: 0.0000 [IU] | INTRAMUSCULAR | Status: DC | PRN

## 2024-06-22 MED ORDER — MORPHINE SULFATE (PF) 2 MG/ML IV SOLN
2.0000 mg | INTRAVENOUS | Status: DC | PRN
  Administered 2024-06-22: 2 mg via INTRAVENOUS
  Filled 2024-06-22: qty 1

## 2024-06-22 MED ORDER — THROMBIN 20000 UNITS EX SOLR
CUTANEOUS | Status: DC | PRN
  Administered 2024-06-22: 20 mL

## 2024-06-22 MED ORDER — ACETAMINOPHEN 10 MG/ML IV SOLN
INTRAVENOUS | Status: AC
  Filled 2024-06-22: qty 100

## 2024-06-22 MED ORDER — FENTANYL CITRATE (PF) 100 MCG/2ML IJ SOLN
INTRAMUSCULAR | Status: AC
  Filled 2024-06-22: qty 2

## 2024-06-22 MED ORDER — ATORVASTATIN CALCIUM 40 MG PO TABS
40.0000 mg | ORAL_TABLET | Freq: Every day | ORAL | Status: DC
  Administered 2024-06-22: 40 mg via ORAL
  Filled 2024-06-22 (×2): qty 1

## 2024-06-22 MED ORDER — EMPAGLIFLOZIN 25 MG PO TABS
25.0000 mg | ORAL_TABLET | Freq: Every day | ORAL | Status: DC
  Administered 2024-06-22: 25 mg via ORAL
  Filled 2024-06-22 (×2): qty 1

## 2024-06-22 MED ORDER — BUPIVACAINE-EPINEPHRINE 0.25% -1:200000 IJ SOLN
INTRAMUSCULAR | Status: DC | PRN
  Administered 2024-06-22: 10 mL

## 2024-06-22 MED ORDER — OXYCODONE HCL 5 MG/5ML PO SOLN: 5.0000 mg | Freq: Once | ORAL | Status: DC | PRN

## 2024-06-22 MED ORDER — EPHEDRINE SULFATE-NACL 50-0.9 MG/10ML-% IV SOSY
PREFILLED_SYRINGE | INTRAVENOUS | Status: DC | PRN
  Administered 2024-06-22: 5 mg via INTRAVENOUS

## 2024-06-22 MED ORDER — ALBUTEROL SULFATE (2.5 MG/3ML) 0.083% IN NEBU: 2.5000 mg | INHALATION_SOLUTION | Freq: Four times a day (QID) | RESPIRATORY_TRACT | Status: DC | PRN

## 2024-06-22 SURGICAL SUPPLY — 47 items
BAG COUNTER SPONGE SURGICOUNT (BAG) ×1 IMPLANT
BNDG GAUZE DERMACEA FLUFF 4 (GAUZE/BANDAGES/DRESSINGS) ×1 IMPLANT
CANISTER SUCTION 3000ML PPV (SUCTIONS) ×1 IMPLANT
CLSR STERI-STRIP ANTIMIC 1/2X4 (GAUZE/BANDAGES/DRESSINGS) ×1 IMPLANT
CORD BIPOLAR FORCEPS 12FT (ELECTRODE) ×1 IMPLANT
COVER SURGICAL LIGHT HANDLE (MISCELLANEOUS) ×1 IMPLANT
DRAPE SURG 17X23 STRL (DRAPES) ×1 IMPLANT
DRAPE U-SHAPE 47X51 STRL (DRAPES) ×1 IMPLANT
DRSG OPSITE POSTOP 3X4 (GAUZE/BANDAGES/DRESSINGS) ×1 IMPLANT
DRSG OPSITE POSTOP 4X6 (GAUZE/BANDAGES/DRESSINGS) IMPLANT
DURAPREP 26ML APPLICATOR (WOUND CARE) ×1 IMPLANT
ELECT CAUTERY BLADE 6.4 (BLADE) ×1 IMPLANT
ELECT PENCIL ROCKER SW 15FT (MISCELLANEOUS) ×1 IMPLANT
ELECTRODE BLDE 4.0 EZ CLN MEGD (MISCELLANEOUS) IMPLANT
ELECTRODE REM PT RTRN 9FT ADLT (ELECTROSURGICAL) ×1 IMPLANT
EVACUATOR SILICONE 100CC (DRAIN) IMPLANT
GLOVE BIO SURGEON STRL SZ 6.5 (GLOVE) ×1 IMPLANT
GLOVE BIOGEL PI IND STRL 6.5 (GLOVE) ×1 IMPLANT
GLOVE BIOGEL PI IND STRL 8.5 (GLOVE) ×1 IMPLANT
GLOVE SS BIOGEL STRL SZ 8.5 (GLOVE) ×1 IMPLANT
GOWN STRL REUS W/ TWL LRG LVL3 (GOWN DISPOSABLE) ×2 IMPLANT
GOWN STRL REUS W/TWL 2XL LVL3 (GOWN DISPOSABLE) ×1 IMPLANT
KIT BASIN OR (CUSTOM PROCEDURE TRAY) ×1 IMPLANT
KIT TURNOVER KIT B (KITS) ×1 IMPLANT
NDL 22X1.5 STRL (OR ONLY) (MISCELLANEOUS) ×1 IMPLANT
NDL SPNL 18GX3.5 QUINCKE PK (NEEDLE) ×2 IMPLANT
NEEDLE 22X1.5 STRL (OR ONLY) (MISCELLANEOUS) ×1 IMPLANT
NEEDLE SPNL 18GX3.5 QUINCKE PK (NEEDLE) ×2 IMPLANT
NS IRRIG 1000ML POUR BTL (IV SOLUTION) ×1 IMPLANT
PACK LAMINECTOMY ORTHO (CUSTOM PROCEDURE TRAY) ×1 IMPLANT
PACK UNIVERSAL I (CUSTOM PROCEDURE TRAY) ×1 IMPLANT
PAD ARMBOARD POSITIONER FOAM (MISCELLANEOUS) ×2 IMPLANT
PATTIES SURGICAL .5 X.5 (GAUZE/BANDAGES/DRESSINGS) ×1 IMPLANT
PATTIES SURGICAL .5 X1 (DISPOSABLE) ×1 IMPLANT
SPONGE SURGIFOAM ABS GEL 100 (HEMOSTASIS) IMPLANT
SPONGE T-LAP 4X18 ~~LOC~~+RFID (SPONGE) ×3 IMPLANT
SURGIFLO W/THROMBIN 8M KIT (HEMOSTASIS) IMPLANT
SUT BONE WAX W31G (SUTURE) ×1 IMPLANT
SUT MNCRL+ AB 3-0 CT1 36 (SUTURE) ×1 IMPLANT
SUT VIC AB 1 CT1 18XCR BRD 8 (SUTURE) ×1 IMPLANT
SUT VIC AB 2-0 CT1 18 (SUTURE) ×1 IMPLANT
SYR BULB IRRIG 60ML STRL (SYRINGE) ×1 IMPLANT
SYR CONTROL 10ML LL (SYRINGE) ×1 IMPLANT
TOWEL GREEN STERILE (TOWEL DISPOSABLE) ×1 IMPLANT
TOWEL GREEN STERILE FF (TOWEL DISPOSABLE) ×1 IMPLANT
WATER STERILE IRR 1000ML POUR (IV SOLUTION) ×1 IMPLANT
YANKAUER SUCT BULB TIP NO VENT (SUCTIONS) IMPLANT

## 2024-06-22 NOTE — H&P (Signed)
 History:  Connie Ho is a very pleasant 63 year old woman with progressive buttock and severe radicular left thigh pain. She states that her symptoms have intensified as a relates to the radicular left thigh pain since her last office visit. She has also noticed increasing weakness and greater difficulty arising from a seated position and ambulation.  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   Current Outpatient Medications on File Prior to Encounter  Medication Sig Dispense Refill   atorvastatin  (LIPITOR) 40 MG tablet TAKE ONE TABLET BY MOUTH DAILY 90 tablet 2   diltiazem  (TIAZAC ) 180 MG 24 hr capsule Take 180 mg by mouth 2 (two) times daily.  1   empagliflozin  (JARDIANCE ) 25 MG TABS tablet Take 25 mg by mouth daily.     esomeprazole  (NEXIUM ) 40 MG capsule Take 1 capsule (40 mg total) by mouth 2 (two) times daily before a meal. 180 capsule 3   gabapentin  (NEURONTIN ) 300 MG capsule Take 300 mg by mouth 3 (three) times daily.      glipiZIDE  (GLUCOTROL ) 5 MG tablet Take 5 mg by mouth 2 (two) times daily before a meal.     HYDROcodone-acetaminophen  (NORCO/VICODIN) 5-325 MG tablet Take 1 tablet by mouth 3 (three) times daily as needed for moderate pain (pain score 4-6).     losartan  (COZAAR ) 50 MG tablet Take 50 mg by mouth daily.     methocarbamol  (ROBAXIN ) 500 MG tablet Take 500 mg by mouth 2 (two) times daily as needed for muscle spasms.     metoprolol  succinate (TOPROL -XL) 50 MG 24 hr tablet TAKE ONE TABLET BY MOUTH EVERY DAY WITH OR IMMEDIATELY FOLLOWING A MEAL 90 tablet 3   TRULICITY 1.5 MG/0.5ML SOAJ Inject 1.5 mg into the skin once a week.     albuterol  (PROVENTIL ) (2.5 MG/3ML) 0.083% nebulizer solution Take 2.5 mg by nebulization every 6 (six) hours as needed for wheezing or shortness of breath.     cyanocobalamin (VITAMIN B12) 1000 MCG/ML injection Inject 1,000 mcg into the muscle every 30 (thirty) days.     losartan  (COZAAR ) 25 MG tablet Take 1 tablet (25 mg total) by mouth daily. (Patient not taking: Reported on 06/07/2024) 90 tablet 1   ondansetron  (ZOFRAN  ODT) 4 MG disintegrating tablet Take 1 tablet (4 mg total) by mouth every 8 (eight) hours as needed for nausea or vomiting. (Patient not taking: Reported on 05/17/2024)  20 tablet 0    Physical Exam: Vitals:   06/22/24 1012  BP: (!) 122/58  Pulse: (!) 54  Resp: 18  Temp: 98.2 F (36.8 C)  SpO2: 96%   Body mass index is 31.96 kg/m. Clinical exam: Normal is a pleasant individual, who appears younger than their stated age.  She is alert and orientated 3.  No shortness of breath, chest pain.  Abdomen is soft and non-tender, positive loss of bowel control, no rebound tenderness. Normal bladder control.  Negative: skin lesions abrasions contusions  Peripheral pulses: 2+ peripheral pulses bilaterally. LE compartments are: Soft and nontender.  Gait pattern: Altered gait pattern due to severe radicular left leg pain.  Assistive devices: None  Neuro:  Trace weakness of the quad on the left side. Positive femoral stretch test with reproduction of left radicular leg pain. 4/5 left EHL/tibialis anterior strength. Positive numbness and dysesthesias in the left L3 dermatome. 4-/5 quad and hip flexors strength. 5/5 motor strength in remainder of the lower extremity. Negative Babinski test, no clonus, symmetrical 1+ deep tendon reflexes. Intact perianal sensation to light touch and pinprick. Normal rectal tone and sphincter strength.  Musculoskeletal: Mild low back pain with palpation and range of motion. Well-healed surgical scar from prior L4-5 discectomy  Imaging: X-rays of the lumbar spine completed on 04/17/2024 demonstrate degenerative lumbar disc disease L4-5 L5-S1. No fracture. Moderate multilevel facet arthrosis. No spondylolisthesis.  Lumbar MRI: completed on 04/21/2024: left upper articular disc protrusion at L2-3 with a migrated sequestered disc fragment causing impingement on the left L3 traversing nerve root. Moderate left foraminal narrowing at L3-4. Prior left L4 hemilaminotomy with no recurrent disc herniation. Mild dextroconvex scoliosis apex of the curvature at L3.  A/P:Connie Ho is a very pleasant 63 year old woman with severe radicular left leg pain. Patient has loss of sensation to light touch predominantly in the L3 dermatome as well as progressive weakness in the quad and hip flexor (L3 distribution). Patient now has 4 -/5 motor strength in the L3 dermatome which is a sign of progression. She also has worsening pain and loss in quality of life. The patient is indicated today that she has had multiple episodes of incontinence of bowel but fortunately she has intact perianal sensation and rectal tone and so I do not think this is a cauda equina syndrome. The patient also has left EHL/tibialis anterior weakness (foot drop) but her imaging studies do not demonstrate any recurrent disc herniation or neural compression at the L4-5 level.  At this  point I am it is clear that the patient's neurological deficits have progressed since her last office visit. Imaging confirms a L2/3 disc herniation with a sequestered fragment that is cranially migrated causing compression of the L3 nerve root. Given the correlation between her neurological deficit and imaging studies I believe it is warranted to move forward with a lumbar decompression and discectomy. In order to adequately decompress the area I have recommended a left L3 hemilaminotomy possibly hemilaminectomy to allow for complete removal of the sequestered fragment and decompression of the area. I have gone over the surgical procedure in great detail with the patient and her husband and they have expressed an understanding of the risks as well as a willingness to move forward with surgery.  Risks and benefits of lumbar decompression/discectomy: Infection, bleeding, death, stroke, paralysis, ongoing or worse pain, need for additional surgery, leak of spinal fluid, adjacent segment degeneration requiring additional surgery, post-operative hematoma formation that can result in neurological compromise and the  need for urgent/emergent re-operation. Loss in bowel and bladder control. Injury to major vessels that could result in the need for urgent abdominal surgery to stop bleeding. Risk of deep venous thrombosis (DVT) and the need for additional treatment. Recurrent disc herniation resulting in the need for revision surgery, which could include fusion surgery (utilizing instrumentation such as pedicle screws and intervertebral cages).

## 2024-06-22 NOTE — OR Nursing (Signed)
 Radiologist Dr.Liebkeman called at 1358 to verify that instrumentation was correctly visualized at L2-L3

## 2024-06-22 NOTE — Discharge Instructions (Signed)

## 2024-06-22 NOTE — Brief Op Note (Signed)
 06/22/2024  3:02 PM  PATIENT:  Connie Ho  63 y.o. female  PRE-OPERATIVE DIAGNOSIS:  L2-3 disc herniation and radiculopathy Left  POST-OPERATIVE DIAGNOSIS:  L2-3 disc herniation and radiculopathy Left  PROCEDURE:  Procedure(s) with comments: LUMBAR LAMINECTOMY/DECOMPRESSION WITH DISCECTOMY (Left) - L2-L3 decompression/discectomy  SURGEON:  Surgeons and Role:    DEWAINE Burnetta Aures, MD - Primary  PHYSICIAN ASSISTANT: Jeoffrey Sages, PA  ASSISTANTS:    ANESTHESIA:   general  EBL:  40 mL   BLOOD ADMINISTERED:none  DRAINS: none   LOCAL MEDICATIONS USED:  MARCAINE      SPECIMEN:  No Specimen  DISPOSITION OF SPECIMEN:  N/A  COUNTS:  YES  TOURNIQUET:  * No tourniquets in log *  DICTATION: .Dragon Dictation  PLAN OF CARE: Admit for overnight observation  PATIENT DISPOSITION:  PACU - hemodynamically stable.

## 2024-06-22 NOTE — Op Note (Signed)
 OPERATIVE REPORT  DATE OF SURGERY: 06/22/2024  PATIENT NAME:  Connie Ho MRN: [id number] DOB: November 02, 1961  PCP: Rosamond Leta NOVAK, MD  PRE-OPERATIVE DIAGNOSIS: L2-3 disc herniation with left L3 radiculopathy  POST-OPERATIVE DIAGNOSIS: Same  PROCEDURE:   L2-3 decompression with discectomy  SURGEON:  Donaciano Sprang, MD  PHYSICIAN ASSISTANT: Jeoffrey Sages, PA  ANESTHESIA:   General  EBL: 40 ml   Complications: None  BRIEF HISTORY: Connie Ho is a 63 y.o. female who presents with significant back and radicular left leg pain and left leg weakness.  Imaging demonstrated an L2-3 disc herniation with migration causing L3 nerve compression.  Attempts at conservative management failed to alleviate her pain or improve her quality of life.  As result we elected to move forward with surgery.  All risks, benefits, alternatives were discussed and consent was obtained  PROCEDURE DETAILS: Patient was brought into the operating room and was properly positioned on the operating room table.  After induction with general anesthesia the patient was endotracheally intubated.  A timeout was taken to confirm all important data: including patient, procedure, and the level. Teds, SCD's were applied.   A Foley was placed by the nurse and the patient was turned prone onto the Wilson frame.  All bony prominences were well-padded and the back was prepped and draped in standard fashion.  2 needles were placed in the back and an x-ray was taken to localize our skin incision.  I marked out my midline incision and infiltrated with quarter percent Marcaine  with epinephrine .  Midline incision was made and sharp dissection was carried out down to the deep fascia.  I incised the deep fascia and stripped the paraspinal muscles to expose the L2 spinous process and lamina bilaterally.  Retractors were placed and a Penfield 4 was placed on the L2 lamina.  An x-ray was taken and we confirmed we are at the appropriate  level.  The inferior third of the spinous process of L2 was taken down and a generous laminotomy was performed on the left side.  I then dissected with a Penfield 4 through the central raphae of the ligamentum flavum and create a plane between the thecal sac and the ligamentum flavum.  I resected the ligamentum flavum to expose the thecal sac centrally and going into the left lateral corner.  I then continued to mobilize the thecal sac and nerve root medially and then performed a medial facetectomy.  I the disc herniation and came into view.  Because of the superior migration I did a very extensive hemilaminotomy of L2 in order to completely visualize the disc herniation.  Once I was able to mobilize the thecal sac medially I incised the annulus and with a nerve hook mobilized the disc fragment and removed multiple large fragments of disc material.  I then continued inferiorly and identified the medial border of the L3 pedicle as well as the L3 nerve root.  I then swept underneath the nerve root with my nerve hook and delivered 2 additional fragments of disc material.  I then swept medially with my nerve hook followed by an Epstein curette to debride some of the osteophyte.  At this point I was able to freely pass my [facility] elevator superiorly towards the L2 foramen medially and inferiorly in the lateral recess and out the L3 foramen.  The nerve root itself was very mobile and no longer under compression.  I irrigated the wound copiously normal saline and used bipolar electrocautery to obtain hemostasis.  Thrombin -soaked Gelfoam patty was then placed over the laminotomy site and the retractors were removed.  I then closed the deep fascia with interrupted Vicryl sutures, followed by a layer 2-0 Vicryl sutures and finally 3-0 Monocryl for the skin.  Steri-Strips and dry dressings were applied and the patient was ultimately extubated and transferred the PACU without incident.  At the end of the case all needle and  sponge counts were correct.  Donaciano Sprang, MD 06/22/2024 2:55 PM

## 2024-06-22 NOTE — Anesthesia Procedure Notes (Signed)
 Procedure Name: Intubation Date/Time: 06/22/2024 1:12 PM  Performed by: Zaraya Delauder J, CRNAPre-anesthesia Checklist: Patient identified, Emergency Drugs available, Suction available and Patient being monitored Patient Re-evaluated:Patient Re-evaluated prior to induction Oxygen Delivery Method: Circle System Utilized Preoxygenation: Pre-oxygenation with 100% oxygen Induction Type: IV induction Ventilation: Mask ventilation without difficulty Laryngoscope Size: Miller and 3 Grade View: Grade II Tube type: Oral Tube size: 7.0 mm Number of attempts: 1 Airway Equipment and Method: Stylet and Oral airway Placement Confirmation: ETT inserted through vocal cords under direct vision, positive ETCO2 and breath sounds checked- equal and bilateral Secured at: 21 cm Tube secured with: Tape Dental Injury: Teeth and Oropharynx as per pre-operative assessment

## 2024-06-22 NOTE — Transfer of Care (Signed)
 Immediate Anesthesia Transfer of Care Note  Patient: Connie Ho  Procedure(s) Performed: LUMBAR LAMINECTOMY/DECOMPRESSION WITH DISCECTOMY (Left)  Patient Location: PACU  Anesthesia Type:General  Level of Consciousness: awake, alert , and oriented  Airway & Oxygen Therapy: Patient Spontanous Breathing and Patient connected to face mask oxygen  Post-op Assessment: Report given to RN and Post -op Vital signs reviewed and stable  Post vital signs: Reviewed and stable  Last Vitals:  Vitals Value Taken Time  BP 124/59 06/22/24 15:30  Temp    Pulse 74 06/22/24 15:36  Resp 17 06/22/24 15:36  SpO2 94 % 06/22/24 15:36  Vitals shown include unfiled device data.  Last Pain:  Vitals:   06/22/24 1023  TempSrc:   PainSc: 7       Patients Stated Pain Goal: 4 (06/22/24 1023)  Complications: No notable events documented.

## 2024-06-23 ENCOUNTER — Encounter (HOSPITAL_COMMUNITY): Payer: Self-pay | Admitting: Orthopedic Surgery

## 2024-06-23 DIAGNOSIS — M5116 Intervertebral disc disorders with radiculopathy, lumbar region: Secondary | ICD-10-CM | POA: Diagnosis not present

## 2024-06-23 LAB — GLUCOSE, CAPILLARY: Glucose-Capillary: 156 mg/dL — ABNORMAL HIGH (ref 70–99)

## 2024-06-23 MED ORDER — DIPHENHYDRAMINE HCL 25 MG PO CAPS
25.0000 mg | ORAL_CAPSULE | ORAL | Status: DC | PRN
  Administered 2024-06-23: 25 mg via ORAL
  Administered 2024-06-23: 50 mg via ORAL
  Filled 2024-06-23: qty 2
  Filled 2024-06-23: qty 1

## 2024-06-23 MED ORDER — HYDROCODONE-ACETAMINOPHEN 5-325 MG PO TABS: 2.0000 | ORAL_TABLET | ORAL | Status: DC | PRN

## 2024-06-23 MED ORDER — HYDROCODONE-ACETAMINOPHEN 5-325 MG PO TABS
1.0000 | ORAL_TABLET | ORAL | Status: DC | PRN
  Administered 2024-06-23: 1 via ORAL
  Filled 2024-06-23: qty 1

## 2024-06-23 NOTE — Discharge Summary (Signed)
 Patient ID: Connie Ho MRN: [id number] DOB/AGE: 08-24-1961 63 y.o.  Admit date: 06/22/2024 Discharge date: 06/23/2024  Admission Diagnoses:  Principal Problem:   Lumbar disc herniation   Discharge Diagnoses:  Principal Problem:   Lumbar disc herniation  status post Procedure(s): LUMBAR LAMINECTOMY/DECOMPRESSION WITH DISCECTOMY  Past Medical History:  Diagnosis Date   Asthma    CAD (coronary artery disease)    Nonobstructive, 09/2011 (false positive GXT)   Chronic hepatitis C without mention of hepatic coma    Pt denies   Degenerative disk disease    Diabetes mellitus without complication (HCC)    Dysrhythmia    SVT   Essential hypertension    GERD (gastroesophageal reflux disease)    Headache    Migraines   Hyperlipidemia    Palpitations    Peripheral vascular disease (HCC)    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 female who was admitted 06/22/2024 for operative treatment of Lumbar disc herniation. Patient failed conservative treatments (please see the history and physical for the specifics) and had severe unremitting pain that affects sleep, daily activities and work/hobbies. After pre-op clearance, the patient was taken to the operating room on 06/22/2024 and underwent  Procedure(s): LUMBAR LAMINECTOMY/DECOMPRESSION WITH DISCECTOMY.    Patient was given perioperative antibiotics:  Anti-infectives (From admission, onward)    Start     Dose/Rate Route Frequency Ordered Stop   06/22/24 1830  ceFAZolin  (ANCEF ) IVPB 1 g/50 mL premix        1 g 100 mL/hr over 30 Minutes Intravenous Every 8 hours 06/22/24 1744 06/23/24 0322    06/22/24 1012  ceFAZolin  (ANCEF ) 2-4 GM/100ML-% IVPB       Note to Pharmacy: Barron Friday D: cabinet override      06/22/24 1012 06/22/24 1359   06/22/24 1001  ceFAZolin  (ANCEF ) IVPB 2g/100 mL premix        2 g 200 mL/hr over 30 Minutes Intravenous 30 min pre-op 06/22/24 1001 06/22/24 1317        Patient was given sequential compression devices and early ambulation to prevent DVT.   Patient benefited maximally from hospital stay and there were no complications. At the time of discharge, the patient was urinating/moving their bowels without difficulty, tolerating a regular diet, pain is controlled with oral pain medications and they have been cleared by PT/OT.   Recent vital signs: Patient Vitals for the past 24 hrs:  BP Temp Temp src Pulse Resp SpO2 Height Weight  06/23/24 0345 (!) 107/59 98.6 F (37 C) Oral 62 18 98 % -- --  06/22/24 2322 (!) 110/57 97.9 F (36.6 C) Oral 65 18 98 % -- --  06/22/24 1936 113/64 98.5 F (36.9 C) Oral 75 18 99 % -- --  06/22/24 1737 105/62 97.9 F (36.6 C) Oral 69 18 97 % -- --  06/22/24 1715 (!) 105/56 -- -- 66 15 96 % -- --  06/22/24 1700 106/68 98.6 F (37 C) -- 67 18 95 % -- --  06/22/24 1645 102/66 -- -- 61 12 93 % -- --  06/22/24 1630 114/68 -- -- 68 14 96 % -- --  06/22/24 1615 96/75 -- -- 62 13 98 % -- --  06/22/24 1600 109/71 -- -- 70 18 99 % -- --  06/22/24 1545 112/62 -- -- 75 14 95 % -- --  06/22/24 1530 (!) 124/59 98.6 F (37 C) -- 81 17 97 % -- --  06/22/24 1012 (!) 122/58 98.2 F (36.8 C) Oral (!) 54 18 96 % 5' 6 (1.676 m) 89.8 kg     Recent laboratory studies: No results for input(s): WBC, HGB, HCT, PLT, NA, K, CL, CO2, BUN, CREATININE, GLUCOSE, INR, CALCIUM  in the last 72 hours.  Invalid input(s): PT, 2   Discharge Medications:   Allergies as of 06/23/2024       Reactions   Peanut-containing Drug Products Hives, Swelling   Stayed in hospital for three days; swelled from inside out    Hydrocodone  Itching   Okay to take with benadryl    Aspirin Hives   Ciprofloxacin Itching, Rash   Severe stomach pain         Medication List     STOP taking these medications    HYDROcodone -acetaminophen  5-325 MG tablet Commonly known as: NORCO/VICODIN   ondansetron  4 MG disintegrating tablet Commonly known as: Zofran  ODT   Trulicity 1.5 MG/0.5ML Soaj Generic drug: Dulaglutide       TAKE these medications    albuterol  (2.5 MG/3ML) 0.083% nebulizer solution Commonly known as: PROVENTIL  Take 2.5 mg by nebulization every 6 (six) hours as needed for wheezing or shortness of breath.   atorvastatin  40 MG tablet Commonly known as: LIPITOR TAKE ONE TABLET BY MOUTH DAILY   cyanocobalamin 1000 MCG/ML injection Commonly known as: VITAMIN B12 Inject 1,000 mcg into the muscle every 30 (thirty) days.   diltiazem  180 MG 24 hr capsule Commonly known as: TIAZAC  Take 180 mg by mouth 2 (two) times daily.   empagliflozin  25 MG Tabs tablet Commonly known as: JARDIANCE  Take 25 mg by mouth daily.   esomeprazole  40 MG capsule Commonly known as: NexIUM  Take 1 capsule (40 mg total) by mouth 2 (two) times daily before a meal.   gabapentin  300 MG capsule Commonly known as: NEURONTIN  Take 300 mg by mouth 3 (three) times daily.   glipiZIDE  5 MG tablet Commonly known as: GLUCOTROL  Take 5 mg by mouth 2 (two) times daily before a meal.   losartan  50 MG tablet Commonly known as: COZAAR  Take 50 mg by mouth daily. What changed: Another medication with the same name was removed. Continue taking this medication, and follow the directions you see here.   methocarbamol  500 MG tablet Commonly known as: ROBAXIN  Take 1 tablet (500 mg total) by mouth every 8 (eight) hours as needed for up to 5 days for muscle spasms. What changed: when to take this   metoprolol  succinate 50 MG 24 hr tablet Commonly known as: TOPROL -XL TAKE ONE TABLET BY MOUTH EVERY DAY WITH OR IMMEDIATELY FOLLOWING A  MEAL   ondansetron  4 MG tablet Commonly known as: Zofran  Take 1 tablet (4 mg total) by mouth every 8 (eight) hours as needed for nausea or vomiting.   oxyCODONE -acetaminophen  10-325 MG tablet Commonly known as: Percocet Take 1 tablet by mouth every 6 (six) hours as needed for up to 5 days for pain.        Diagnostic Studies: DG Lumbar Spine 2-3 Views Result Date: 06/22/2024 CLINICAL DATA:  Intraoperative lumbar localization EXAM: LUMBAR SPINE - 2 VIEW COMPARISON:  MRI 10/05/2019 FINDINGS: On image 1 obtained at 1:30 p.m., lateral view of the lumbar spine is observed with needle type probes oriented towards the  L1-2 and L2-3 intervertebral space. On image 2 obtained at 1:42 p.m., tissue spreaders are in place posterior to the L2-3 level with a blunt tip probe oriented towards the L2-3 intervertebral level. Note is made of abdominal aortic atherosclerosis. IMPRESSION: 1. Intraoperative lumbar localization, with the blunt tip probe oriented towards the L2-3 level on the second image. I telephoned these results to [facility] OR # 4 personnel at 1:56 p.m. on 06/22/2024. 2. Aortic Atherosclerosis (ICD10-I70.0). Electronically Signed   By: Ryan Salvage M.D.   On: 06/22/2024 13:58       Follow-up Information     Burnetta Aures, MD. Schedule an appointment as soon as possible for a visit in 2 week(s).   Specialty: Orthopedic Surgery Why: If symptoms worsen, For suture removal, For wound re-check Contact information: [address] [place] [address] [phone]                 Discharge Plan:  discharge to home  Disposition: Kneip is a very pleasant 63 year old woman who underwent a lumbar decompression L2-3.  Disc herniation causing left radicular leg pain.  Postoperatively she noted that the leg pain had significantly improved.  Her primary complaint is back pain.  Patient was started on oxycodone  however this caused significant pruritus and so she  was changed to hydrocodone  which she tolerated better.  The patient had positive flatus and was voiding spontaneously.  Patient was ambulating independently and was evaluated by PT to aid in stair management.  There were no complications postoperatively.  Patient may discharge to home today.  I will electronically send a prescription for hydrocodone  10/325 1 p.o. every 6 as needed.  Patient will be given a prescription for Robaxin  and Zofran .  Patient has been given all appropriate discharge instructions.  She will follow-up with me in 2 weeks for wound check.  If any issues or problems arise she knows to contact me and I will be happy to see her sooner.  Signed: Aures JONETTA Burnetta for Dr. Aures Burnetta [facility] [phone] 06/23/2024, 7:09 AM

## 2024-06-23 NOTE — Progress Notes (Signed)
 Patient alert and oriented, ambulate, void. Surgical site clean and dry no sign of infection. D/c instructions explain and given to the patient. All questions answered.

## 2024-06-23 NOTE — Evaluation (Signed)
 Physical Therapy Brief Evaluation and Discharge Note Patient Details Name: Connie Ho MRN: [id number] DOB: [birth year] Today's Date: 06/23/2024   History of Present Illness  Pt is a 63 y.o. F who presents 06/22/2024 with L2-3 disc herniation now s/p lumbar laminectomy/decompression with discectomy. Significant PMH: CAD, DM, PVD, postinflammatory pulmonary fibrosis, PSVT, prior back surgery.  Clinical Impression  Patient evaluated by Physical Therapy with no further acute PT needs identified. Pt reports good pain control and denies radicular symptoms. Continues with left hip flexor weakness and encouraged seated vs standing marching and walking program for strengthening. Pt overall is mobilizing fairly, ambulating 400 ft with no assistive device with slowed speed. Education provided regarding spinal precautions for comfort and brace use. All education has been completed and the patient has no further questions. No follow-up Physical Therapy or equipment needs. PT is signing off. Thank you for this referral.      PT Assessment Patient does not need any further PT services  Assistance Needed at Discharge  PRN    Equipment Recommendations None recommended by PT  Recommendations for Other Services       Precautions/Restrictions Precautions Precautions: Back Precaution Booklet Issued: Yes (comment) Recall of Precautions/Restrictions: Intact Required Braces or Orthoses: Spinal Brace Spinal Brace: Lumbar corset;Applied in standing position Restrictions Weight Bearing Restrictions Per Provider Order: No        Mobility  Bed Mobility Rolling: Modified independent (Device/Increase time) Supine/Sidelying to sit: Modified independent (Device/Increased time)      Transfers Overall transfer level: Independent Equipment used: None                    Ambulation/Gait Ambulation/Gait assistance: Modified independent (Device/Increase time) Gait Distance (Feet): 400  Feet Assistive device: None Gait Pattern/deviations: Step-through pattern, Decreased stride length Gait Speed: Below normal General Gait Details: Slow pace, no gross unsteadiness with head turns or stepping over obstacles  Home Activity Instructions    Stairs            Modified Rankin (Stroke Patients Only)        Balance Overall balance assessment: Mild deficits observed, not formally tested                        Pertinent Vitals/Pain PT - Brief Vital Signs All Vital Signs Stable: Yes Pain Assessment Pain Assessment: Faces Faces Pain Scale: Hurts a little bit Pain Location: back Pain Descriptors / Indicators: Operative site guarding Pain Intervention(s): Monitored during session     Home Living Family/patient expects to be discharged to:: Private residence Living Arrangements: Spouse/significant other;Other (Comment) (granddaughter) Available Help at Discharge: Family Home Environment: Ramped entrance   Home Equipment: Standard Walker;BSC/3in1        Prior Function Level of Independence: Independent      UE/LE Assessment   UE ROM/Strength/Tone/Coordination: WFL    LE ROM/Strength/Tone/Coordination: Impaired LE ROM/Strength/Tone/Coordination Deficits: Grossly 4/5 except L hip flexion 2+/5    Communication   Communication Communication: No apparent difficulties     Cognition Overall Cognitive Status: Appears within functional limits for tasks assessed/performed       General Comments      Exercises     Assessment/Plan    PT Problem List         PT Visit Diagnosis Pain    No Skilled PT All education completed;Patient will have necessary level of assist by caregiver at discharge;Patient is modified independent with all activity/mobility   Co-evaluation  AMPAC 6 Clicks Help needed turning from your back to your side while in a flat bed without using bedrails?: None Help needed moving from lying on your  back to sitting on the side of a flat bed without using bedrails?: None Help needed moving to and from a bed to a chair (including a wheelchair)?: None Help needed standing up from a chair using your arms (e.g., wheelchair or bedside chair)?: None Help needed to walk in hospital room?: None Help needed climbing 3-5 steps with a railing? : A Little 6 Click Score: 23      End of Session Equipment Utilized During Treatment: Back brace Activity Tolerance: Patient tolerated treatment well Patient left: in bed;with call bell/phone within reach;with family/visitor present Nurse Communication: Mobility status PT Visit Diagnosis: Pain Pain - part of body:  (back)     Time: 9089-9071 PT Time Calculation (min) (ACUTE ONLY): 18 min  Charges:   PT Evaluation $PT Eval Low Complexity: 1 Low      Aleck Ho, PT, DPT Acute Rehabilitation Services Office [phone]   Connie Ho  06/23/2024, 10:20 AM

## 2024-06-26 NOTE — Anesthesia Postprocedure Evaluation (Signed)
 Anesthesia Post Note  Patient: Connie Ho  Procedure(s) Performed: LUMBAR LAMINECTOMY/DECOMPRESSION WITH DISCECTOMY (Left)     Patient location during evaluation: PACU Anesthesia Type: General Level of consciousness: awake and alert Pain management: pain level controlled Vital Signs Assessment: post-procedure vital signs reviewed and stable Respiratory status: spontaneous breathing, nonlabored ventilation, respiratory function stable and patient connected to nasal cannula oxygen Cardiovascular status: blood pressure returned to baseline and stable Postop Assessment: no apparent nausea or vomiting Anesthetic complications: no   No notable events documented.  Last Vitals:  Vitals:   06/23/24 0345 06/23/24 0712  BP: (!) 107/59 118/62  Pulse: 62 (!) 59  Resp: 18 20  Temp: 37 C 37 C  SpO2: 98% 98%    Last Pain:  Vitals:   06/23/24 0738  TempSrc:   PainSc: 3                  Connie Ho

## 2024-07-18 ENCOUNTER — Other Ambulatory Visit: Payer: Self-pay | Admitting: *Deleted

## 2024-07-18 ENCOUNTER — Telehealth: Payer: Self-pay | Admitting: Cardiology

## 2024-07-18 DIAGNOSIS — I7121 Aneurysm of the ascending aorta, without rupture: Secondary | ICD-10-CM

## 2024-07-18 DIAGNOSIS — I10 Essential (primary) hypertension: Secondary | ICD-10-CM

## 2024-07-18 NOTE — Telephone Encounter (Signed)
 Checking percert on the following patient for testing scheduled at [facility]    CT ANGIO CHEST AORTA W/CM &/OR   09/05/2024

## 2024-07-18 NOTE — Progress Notes (Signed)
 BMET required before 09/05/24 CT. Request to have lab done at [facility] 1-2 weeks before CT. Aware that order placed.

## 2024-08-10 DIAGNOSIS — M545 Low back pain, unspecified: Secondary | ICD-10-CM | POA: Diagnosis not present

## 2024-08-15 DIAGNOSIS — M545 Low back pain, unspecified: Secondary | ICD-10-CM | POA: Diagnosis not present

## 2024-08-18 DIAGNOSIS — M545 Low back pain, unspecified: Secondary | ICD-10-CM | POA: Diagnosis not present

## 2024-08-29 DIAGNOSIS — M545 Low back pain, unspecified: Secondary | ICD-10-CM | POA: Diagnosis not present

## 2024-09-05 ENCOUNTER — Other Ambulatory Visit (HOSPITAL_COMMUNITY)
Admission: RE | Admit: 2024-09-05 | Discharge: 2024-09-05 | Disposition: A | Discharge status: Home / Self Care | Source: Ambulatory Visit | Attending: Cardiology | Admitting: Cardiology

## 2024-09-05 ENCOUNTER — Ambulatory Visit (HOSPITAL_COMMUNITY)
Admission: RE | Admit: 2024-09-05 | Discharge: 2024-09-05 | Disposition: A | Discharge status: Home / Self Care | Source: Ambulatory Visit | Attending: Cardiology | Admitting: Cardiology

## 2024-09-05 DIAGNOSIS — K760 Fatty (change of) liver, not elsewhere classified: Secondary | ICD-10-CM | POA: Diagnosis not present

## 2024-09-05 DIAGNOSIS — I77819 Aortic ectasia, unspecified site: Secondary | ICD-10-CM | POA: Diagnosis not present

## 2024-09-05 DIAGNOSIS — I10 Essential (primary) hypertension: Secondary | ICD-10-CM | POA: Insufficient documentation

## 2024-09-05 DIAGNOSIS — I7121 Aneurysm of the ascending aorta, without rupture: Secondary | ICD-10-CM | POA: Insufficient documentation

## 2024-09-05 DIAGNOSIS — M545 Low back pain, unspecified: Secondary | ICD-10-CM | POA: Diagnosis not present

## 2024-09-05 LAB — BASIC METABOLIC PANEL WITH GFR
Anion gap: 12 (ref 5–15)
BUN: 11 mg/dL (ref 8–23)
CO2: 24 mmol/L (ref 22–32)
Calcium: 9.7 mg/dL (ref 8.9–10.3)
Chloride: 101 mmol/L (ref 98–111)
Creatinine, Ser: 0.71 mg/dL (ref 0.44–1.00)
GFR, Estimated: >60 mL/min (ref >60)
Glucose, Bld: 221 mg/dL — ABNORMAL HIGH (ref 70–99)
Potassium: 3.7 mmol/L (ref 3.5–5.1)
Sodium: 137 mmol/L (ref 135–145)

## 2024-09-05 MED ORDER — IOHEXOL 350 MG/ML SOLN
75.0000 mL | Freq: Once | INTRAVENOUS | Status: AC | PRN
  Administered 2024-09-05: 75 mL via INTRAVENOUS

## 2024-09-07 DIAGNOSIS — M545 Low back pain, unspecified: Secondary | ICD-10-CM | POA: Diagnosis not present

## 2024-09-11 ENCOUNTER — Ambulatory Visit: Payer: Self-pay | Admitting: Cardiology

## 2024-09-14 DIAGNOSIS — M545 Low back pain, unspecified: Secondary | ICD-10-CM | POA: Diagnosis not present

## 2024-09-19 DIAGNOSIS — M545 Low back pain, unspecified: Secondary | ICD-10-CM | POA: Diagnosis not present

## 2024-10-10 ENCOUNTER — Encounter: Payer: Self-pay | Admitting: Cardiology

## 2024-10-10 ENCOUNTER — Ambulatory Visit: Attending: Cardiology | Admitting: Cardiology

## 2024-10-10 VITALS — BP 115/72 | HR 67 | Ht 66.0 in | Wt 202.4 lb

## 2024-10-10 DIAGNOSIS — T466X5D Adverse effect of antihyperlipidemic and antiarteriosclerotic drugs, subsequent encounter: Secondary | ICD-10-CM

## 2024-10-10 DIAGNOSIS — I77819 Aortic ectasia, unspecified site: Secondary | ICD-10-CM | POA: Diagnosis not present

## 2024-10-10 DIAGNOSIS — I2510 Atherosclerotic heart disease of native coronary artery without angina pectoris: Secondary | ICD-10-CM | POA: Diagnosis not present

## 2024-10-10 DIAGNOSIS — E782 Mixed hyperlipidemia: Secondary | ICD-10-CM

## 2024-10-10 DIAGNOSIS — T466X5A Adverse effect of antihyperlipidemic and antiarteriosclerotic drugs, initial encounter: Secondary | ICD-10-CM

## 2024-10-10 DIAGNOSIS — I10 Essential (primary) hypertension: Secondary | ICD-10-CM | POA: Diagnosis not present

## 2024-10-10 DIAGNOSIS — M791 Myalgia, unspecified site: Secondary | ICD-10-CM | POA: Diagnosis not present

## 2024-10-10 DIAGNOSIS — I471 Supraventricular tachycardia, unspecified: Secondary | ICD-10-CM

## 2024-10-10 NOTE — Progress Notes (Signed)
    Cardiology Office Note  Date: 10/10/2024   ID: Connie Ho, DOB 1961-01-11, MRN [id number]  History of Present Illness: Connie Ho is a 63 y.o. female last seen in January.  She is here for a follow-up visit.  Overall doing well from a cardiac perspective.  She has only rare, brief palpitations, no increasing pattern or duration.  No exertional chest pain.  I went over her medications.  She did tolerate Lipitor 40 mg daily and has had nice reduction in LDL so far, most recently 80 and down from 189 last year.  Recent follow-up chest CTA showed stable ascending aortic dilatation at 40 mm.  This has been the case for the last several years.  Blood pressure is normal today on current medications.  Physical Exam: VS:  BP 115/72   Pulse 67   Ht 5' 6 (1.676 m)   Wt 202 lb 6.4 oz (91.8 kg)   SpO2 96%   BMI 32.67 kg/m , BMI Body mass index is 32.67 kg/m.  Wt Readings from Last 3 Encounters:  10/10/24 202 lb 6.4 oz (91.8 kg)  06/22/24 198 lb (89.8 kg)  06/14/24 199 lb 12.8 oz (90.6 kg)    General: Patient appears comfortable at rest. HEENT: Conjunctiva and lids normal. Neck: Supple, no elevated JVP or carotid bruits. Lungs: Clear to auscultation, nonlabored breathing at rest. Cardiac: Regular rate and rhythm, no S3 or significant systolic murmur.  ECG:  An ECG dated 01/12/2024 was personally reviewed today and demonstrated:  Sinus rhythm with prolonged PR interval.  Labwork: 06/14/2024: ALT 59; AST 37; Hemoglobin 15.6; Platelets 272 09/05/2024: BUN 11; Creatinine, Ser 0.71; Potassium 3.7; Sodium 137  September 2025: Cholesterol 148, triglycerides 126, HDL 46, LDL 80  Other Studies Reviewed Today:  No interval cardiac testing for review today.  Assessment and Plan:  1.  PSVT.  No increasing sense of palpitations or increasing duration of symptoms.  I reviewed her ECG from earlier in the year.  Currently on diltiazem  CD 180 mg twice daily and Toprol -XL 50 mg daily.   Continue observation.   2.  History of mild nonobstructive CAD by cardiac catheterization in 2012.  No active angina.  Currently on Jardiance  25 mg daily for concurrent management of diabetes, also Lipitor 40 mg daily.   3.  Asymptomatic ascending aortic dilatation.  Recent chest CTA in September showed overall stable findings, 40 mm dimension.  This has been stable over the last several years.  May skip reimaging next year presuming clinically stable otherwise.   4.  Mixed hyperlipidemia.  History of myalgias on Crestor and likely familial hyperlipidemia.  LDL down to 80 in September (131 in February 2025, 189 in February 2024).  She is tolerating Lipitor 40 mg daily.   5.  Primary hypertension.  Blood pressure is normal today.  Continue Cozaar  50 mg daily.  Disposition:  Follow up 1 year.  Signed, Jayson JUDITHANN Sierras, M.D., F.A.C.C. [facility]

## 2024-10-10 NOTE — Patient Instructions (Signed)

## 2024-10-25 DIAGNOSIS — M7541 Impingement syndrome of right shoulder: Secondary | ICD-10-CM | POA: Diagnosis not present

## 2024-12-09 ENCOUNTER — Other Ambulatory Visit: Payer: Self-pay | Admitting: Cardiology
